# Patient Record
Sex: Male | Born: 1975 | Race: White | Hispanic: No | Marital: Married | Smoking: Never smoker
Health system: Southern US, Community
[De-identification: ages and names within clinical notes are randomized; demographics above are authoritative.]

## PROBLEM LIST (undated history)

## (undated) DIAGNOSIS — R6889 Other general symptoms and signs: Secondary | ICD-10-CM

## (undated) DIAGNOSIS — E785 Hyperlipidemia, unspecified: Secondary | ICD-10-CM

## (undated) DIAGNOSIS — N1330 Unspecified hydronephrosis: Secondary | ICD-10-CM

## (undated) DIAGNOSIS — N281 Cyst of kidney, acquired: Secondary | ICD-10-CM

## (undated) DIAGNOSIS — E0500 Thyrotoxicosis with diffuse goiter without thyrotoxic crisis or storm: Secondary | ICD-10-CM

## (undated) DIAGNOSIS — Z87898 Personal history of other specified conditions: Secondary | ICD-10-CM

## (undated) DIAGNOSIS — E0590 Thyrotoxicosis, unspecified without thyrotoxic crisis or storm: Secondary | ICD-10-CM

## (undated) DIAGNOSIS — I10 Essential (primary) hypertension: Secondary | ICD-10-CM

---

## 2015-01-14 ENCOUNTER — Emergency Department (HOSPITAL_BASED_OUTPATIENT_CLINIC_OR_DEPARTMENT_OTHER)
Admission: EM | Admit: 2015-01-14 | Discharge: 2015-01-14 | Disposition: A | Discharge status: Home / Self Care | Payer: Commercial Managed Care - HMO | Attending: Emergency Medicine | Admitting: Emergency Medicine

## 2015-01-14 ENCOUNTER — Encounter (HOSPITAL_BASED_OUTPATIENT_CLINIC_OR_DEPARTMENT_OTHER): Payer: Self-pay | Admitting: Emergency Medicine

## 2015-01-14 DIAGNOSIS — H1013 Acute atopic conjunctivitis, bilateral: Secondary | ICD-10-CM | POA: Insufficient documentation

## 2015-01-14 DIAGNOSIS — H5713 Ocular pain, bilateral: Secondary | ICD-10-CM | POA: Diagnosis present

## 2015-01-14 MED ORDER — DEXAMETHASONE 4 MG PO TABS: 8.0000 mg | ORAL_TABLET | Freq: Once | ORAL | Status: DC | PRN

## 2015-01-14 MED ORDER — DESLORATADINE 5 MG PO TABS: 5.0000 mg | ORAL_TABLET | Freq: Every day | ORAL | Status: DC

## 2015-01-14 MED ORDER — HYPROMELLOSE (GONIOSCOPIC) 2.5 % OP SOLN: 1.0000 [drp] | Freq: Three times a day (TID) | OPHTHALMIC | Status: AC | PRN

## 2015-01-14 NOTE — ED Notes (Signed)
Eyes swollen ,nasal congestion, eyes watering

## 2015-01-14 NOTE — Discharge Instructions (Signed)
Allergic Conjunctivitis  The conjunctiva is a thin membrane that covers the visible white part of the eyeball and the underside of the eyelids. This membrane protects and lubricates the eye. The membrane has small blood vessels running through it that can normally be seen. When the conjunctiva becomes inflamed, the condition is called conjunctivitis. In response to the inflammation, the conjunctival blood vessels become swollen. The swelling results in redness in the normally white part of the eye.  The blood vessels of this membrane also react when a person has allergies and is then called allergic conjunctivitis. This condition usually lasts for as long as the allergy persists. Allergic conjunctivitis cannot be passed to another person (non-contagious). The likelihood of bacterial infection is great and the cause is not likely due to allergies if the inflamed eye has:  · A sticky discharge.  · Discharge or sticking together of the lids in the morning.  · Scaling or flaking of the eyelids where the eyelashes come out.  · Red swollen eyelids.  CAUSES   · Viruses.  · Irritants such as foreign bodies.  · Chemicals.  · General allergic reactions.  · Inflammation or serious diseases in the inside or the outside of the eye or the orbit (the boney cavity in which the eye sits) can cause a "red eye."  SYMPTOMS   · Eye redness.  · Tearing.  · Itchy eyes.  · Burning feeling in the eyes.  · Clear drainage from the eye.  · Allergic reaction due to pollens or ragweed sensitivity. Seasonal allergic conjunctivitis is frequent in the spring when pollens are in the air and in the fall.  DIAGNOSIS   This condition, in its many forms, is usually diagnosed based on the history and an ophthalmological exam. It usually involves both eyes. If your eyes react at the same time every year, allergies may be the cause. While most "red eyes" are due to allergy or an infection, the role of an eye (ophthalmological) exam is important. The exam  can rule out serious diseases of the eye or orbit.  TREATMENT   · Non-antibiotic eye drops, ointments, or medications by mouth may be prescribed if the ophthalmologist is sure the conjunctivitis is due to allergies alone.  · Over-the-counter drops and ointments for allergic symptoms should be used only after other causes of conjunctivitis have been ruled out, or as your caregiver suggests.  Medications by mouth are often prescribed if other allergy-related symptoms are present. If the ophthalmologist is sure that the conjunctivitis is due to allergies alone, treatment is normally limited to drops or ointments to reduce itching and burning.  HOME CARE INSTRUCTIONS   · Wash hands before and after applying drops or ointments, or touching the inflamed eye(s) or eyelids.  · Do not let the eye dropper tip or ointment tube touch the eyelid when putting medicine in your eye.  · Stop using your soft contact lenses and throw them away. Use a new pair of lenses when recovery is complete. You should run through sterilizing cycles at least three times before use after complete recovery if the old soft contact lenses are to be used. Hard contact lenses should be stopped. They need to be thoroughly sterilized before use after recovery.  · Itching and burning eyes due to allergies is often relieved by using a cool cloth applied to closed eye(s).  SEEK MEDICAL CARE IF:   · Your problems do not go away after two or three days of treatment.  ·   Your lids are sticky (especially in the morning when you wake up) or stick together.  · Discharge develops. Antibiotics may be needed either as drops, ointment, or by mouth.  · You have extreme light sensitivity.  · An oral temperature above 102° F (38.9° C) develops.  · Pain in or around the eye or any other visual symptom develops.  MAKE SURE YOU:   · Understand these instructions.  · Will watch your condition.  · Will get help right away if you are not doing well or get worse.  Document  Released: 07/15/2002 Document Revised: 07/17/2011 Document Reviewed: 06/10/2007  [employer]® Patient Information ©2015 [employer]. This information is not intended to replace advice given to you by your health care provider. Make sure you discuss any questions you have with your health care provider.

## 2015-01-14 NOTE — ED Provider Notes (Signed)
CSN: [id number]     Arrival date & time 01/14/15  6440 History   First MD Initiated Contact with Patient 01/14/15 [phone]     Chief Complaint  Patient presents with  . Allergies     (Consider location/radiation/quality/duration/timing/severity/associated sxs/prior Treatment) Patient is a 39 y.o. male presenting with eye problem. The history is provided by the patient.  Eye Problem Location:  Both Quality:  Aching (swelling) Severity:  Moderate Onset quality:  Gradual Duration:  3 days Timing:  Constant Progression:  Worsening Chronicity:  New Context comment:  Walking at park Relieved by:  Nothing Worsened by:  Nothing tried Ineffective treatments:  None tried Associated symptoms: inflammation and redness   Associated symptoms: no decreased vision, no facial rash and no photophobia   Risk factors: no conjunctival hemorrhage     History reviewed. No pertinent past medical history. History reviewed. No pertinent past surgical history. No family history on file. Social History  Substance Use Topics  . Smoking status: None  . Smokeless tobacco: None  . Alcohol Use: None    Review of Systems  Eyes: Positive for redness. Negative for photophobia.  All other systems reviewed and are negative.     Allergies  Review of patient's allergies indicates no known allergies.  Home Medications   Prior to Admission medications   Medication Sig Start Date End Date Taking? Authorizing Provider  desloratadine (CLARINEX) 5 MG tablet Take 1 tablet (5 mg total) by mouth daily. 01/14/15   Lyndal Pulley, MD  dexamethasone (DECADRON) 4 MG tablet Take 2 tablets (8 mg total) by mouth once as needed (worsening eye swelling). 01/14/15   Lyndal Pulley, MD  hydroxypropyl methylcellulose / hypromellose (ISOPTO TEARS / GONIOVISC) 2.5 % ophthalmic solution Place 1 drop into both eyes 3 (three) times daily as needed for dry eyes. 01/14/15   Lyndal Pulley, MD   BP 130/65 mmHg  Pulse 94  Temp(Src) 98.1 F  (36.7 C) (Oral)  Resp 16  Ht  (1.727 m)  Wt 205 lb (92.987 kg)  BMI 31.18 kg/m2  SpO2 99% Physical Exam  Constitutional: He is oriented to person, place, and time. He appears well-developed and well-nourished. No distress.  HENT:  Head: Normocephalic and atraumatic.  Eyes: Conjunctivae are normal.  Mild periorbital swelling, conjunctival injection  Neck: Neck supple. No tracheal deviation present.  Cardiovascular: Normal rate and regular rhythm.   Pulmonary/Chest: Effort normal. No respiratory distress.  Abdominal: Soft. He exhibits no distension.  Neurological: He is alert and oriented to person, place, and time.  Skin: Skin is warm and dry.  Psychiatric: He has a normal mood and affect.    ED Course  Procedures (including critical care time) Labs Review Labs Reviewed - No data to display  Imaging Review No results found. I have personally reviewed and evaluated these images and lab results as part of my medical decision-making.   EKG Interpretation None      MDM   Final diagnoses:  Allergic conjunctivitis, bilateral    39 y.o. male presents with eye swelling, injection since a day at the park 3 days ago. Suspect environmental allergies. Recommended antihistamines and provided with single dose of decadron for home if continuing to worsen despite therapy. Plan to follow up with PCP as needed and return precautions discussed for worsening or new concerning symptoms.     Lyndal Pulley, MD 01/15/15 [phone]

## 2015-02-23 ENCOUNTER — Encounter: Payer: Self-pay | Admitting: Endocrinology

## 2015-02-23 ENCOUNTER — Other Ambulatory Visit (INDEPENDENT_AMBULATORY_CARE_PROVIDER_SITE_OTHER): Payer: Commercial Managed Care - HMO

## 2015-02-23 ENCOUNTER — Ambulatory Visit (INDEPENDENT_AMBULATORY_CARE_PROVIDER_SITE_OTHER): Payer: Commercial Managed Care - HMO | Admitting: Endocrinology

## 2015-02-23 VITALS — BP 128/80 | HR 115 | Temp 98.6°F | Ht 69.0 in | Wt 212.0 lb

## 2015-02-23 DIAGNOSIS — E0590 Thyrotoxicosis, unspecified without thyrotoxic crisis or storm: Secondary | ICD-10-CM

## 2015-02-23 LAB — TSH: TSH: 0.01 u[IU]/mL — AB (ref 0.35–4.50)

## 2015-02-23 LAB — T4, FREE: Free T4: 2.64 ng/dL — ABNORMAL HIGH (ref 0.60–1.60)

## 2015-02-23 MED ORDER — METOPROLOL SUCCINATE ER 25 MG PO TB24: 25.0000 mg | ORAL_TABLET | Freq: Every day | ORAL | Status: DC

## 2015-02-23 MED ORDER — METHIMAZOLE 10 MG PO TABS: 20.0000 mg | ORAL_TABLET | Freq: Three times a day (TID) | ORAL | Status: DC

## 2015-02-23 NOTE — Progress Notes (Signed)
Subjective:    Patient ID: Antonio Grimes, male    DOB: Mar 02, 1976, 39 y.o.   MRN: [id number]  HPI Pt reports 6 weeks of slight swelling around the eyes, and assoc swelling of the legs.  he has no prior h/o hyperthyroid dz.  He has never been on therapy for this.  He has never had XRT to the anterior neck, or thyroid surgery.  He has never had thyroid imaging.  He does not consume kelp or any other prescribed or non-prescribed thyroid medication.  He has never been on amiodarone.  Wife says pt's face is less swollen the past 5 days or so.   No past medical history on file.  No past surgical history on file.  Social History   Social History  . Marital Status: Married    Spouse Name: N/A  . Number of Children: N/A  . Years of Education: N/A   Occupational History  . Not on file.   Social History Main Topics  . Smoking status: Never Smoker   . Smokeless tobacco: Not on file  . Alcohol Use: Not on file  . Drug Use: Not on file  . Sexual Activity: Not on file   Other Topics Concern  . Not on file   Social History Narrative    Current Outpatient Prescriptions on File Prior to Visit  Medication Sig Dispense Refill  . desloratadine (CLARINEX) 5 MG tablet Take 1 tablet (5 mg total) by mouth daily. 30 tablet 0  . hydroxypropyl methylcellulose / hypromellose (ISOPTO TEARS / GONIOVISC) 2.5 % ophthalmic solution Place 1 drop into both eyes 3 (three) times daily as needed for dry eyes. 15 mL 12   No current facility-administered medications on file prior to visit.    No Known Allergies  Family History  Problem Relation Age of Onset  . Thyroid disease Maternal Grandfather     BP 128/80 mmHg  Pulse 115  Temp(Src) 98.6 F (37 C) (Oral)  Ht  (1.753 m)  Wt 212 lb (96.163 kg)  BMI 31.29 kg/m2  SpO2 97%  Review of Systems denies headache, hoarseness, visual loss, palpitations, sob, diarrhea, polyuria, muscle weakness, tremor, anxiety, easy bruising, and rhinorrhea.  He has  heat intolerance, excessive diaphoresis, and weight loss.    Objective:   Physical Exam VS: see vs page GEN: no distress HEAD: head: no deformity eyes: no periorbital swelling, no proptosis external nose and ears are normal mouth: no lesion seen NECK: thyroid is slightly and diffusely enlarged CHEST WALL: no deformity.  LUNGS: clear to auscultation.   CV: tachycardic rate, but reg rhythm, no murmur ABD: abdomen is soft, nontender.  no hepatosplenomegaly.  not distended.  no hernia MUSCULOSKELETAL: muscle bulk and strength are grossly normal.  no obvious joint swelling.  gait is normal and steady EXTEMITIES: no deformity.  Trace bilat leg edema.   PULSES: no carotid bruit NEURO:  cn 2-12 grossly intact.   readily moves all 4's.  sensation is intact to touch on all 4's.  No tremor. SKIN:  Normal texture and temperature.  No rash or suspicious lesion is visible.   NODES:  None palpable at the neck PSYCH: alert, well-oriented.  Does not appear anxious nor depressed.   outside test results are reviewed: TSH=0.02 Free T4=2.88  I have reviewed outside records, and summarized: Pt was noted to have hyperthyroidism, and ref here.  Lab Results  Component Value Date   TSH 0.01* 02/23/2015      Assessment &  Plan:  Hyperthyroidism, new, prob due to Grave's dz.  In view of tachycardia, tapazole is preferred over I-131, at least at first.    Patient is advised the following: Patient Instructions  Let's recheck the blood tests.  We'll let you know about the results. If the thyroid is still significantly high, i'll prescribe medication for this, and another med to slow the heart rate.   if ever you have fever while taking methimazole, stop it and call us, because of the risk of a rare side-effect.   Please come back for a follow-up appointment in 2-3 weeks.    addendum: i rx'ed tapazole and toprol.

## 2015-02-23 NOTE — Patient Instructions (Addendum)
Let's recheck the blood tests.  We'll let you know about the results. If the thyroid is still significantly high, i'll prescribe medication for this, and another med to slow the heart rate.   if ever you have fever while taking methimazole, stop it and call us, because of the risk of a rare side-effect.   Please come back for a follow-up appointment in 2-3 weeks.

## 2015-02-24 ENCOUNTER — Telehealth: Payer: Self-pay | Admitting: Endocrinology

## 2015-02-24 NOTE — Telephone Encounter (Signed)
Patient is calling for results of lab work. Send to  [id number] - [place], [place] - [address] [phone] (Phone) [phone] (Fax)

## 2015-02-25 ENCOUNTER — Telehealth: Payer: Self-pay | Admitting: Endocrinology

## 2015-02-25 ENCOUNTER — Other Ambulatory Visit: Payer: Self-pay | Admitting: Endocrinology

## 2015-02-25 MED ORDER — METHIMAZOLE 10 MG PO TABS: 20.0000 mg | ORAL_TABLET | Freq: Three times a day (TID) | ORAL | Status: DC

## 2015-02-25 NOTE — Telephone Encounter (Signed)
Please release labs to My Chart

## 2015-02-25 NOTE — Telephone Encounter (Signed)
Please load ref provider from faxed paperwork (in scanning bin), and cc note to ref provider

## 2015-02-25 NOTE — Telephone Encounter (Signed)
Patient would like lab results sent to my chart

## 2015-02-25 NOTE — Telephone Encounter (Signed)
done

## 2015-03-02 NOTE — Telephone Encounter (Signed)
I searched for the paperwork and the papers have already been sent to scanning. I contacted the pt and left a voicemail requesting a call back to verify PCP.

## 2015-03-09 ENCOUNTER — Ambulatory Visit (INDEPENDENT_AMBULATORY_CARE_PROVIDER_SITE_OTHER): Payer: Commercial Managed Care - HMO | Admitting: Endocrinology

## 2015-03-09 ENCOUNTER — Encounter: Payer: Self-pay | Admitting: Endocrinology

## 2015-03-09 VITALS — BP 122/87 | HR 90 | Temp 98.1°F | Ht 69.0 in | Wt 210.0 lb

## 2015-03-09 DIAGNOSIS — E0590 Thyrotoxicosis, unspecified without thyrotoxic crisis or storm: Secondary | ICD-10-CM | POA: Diagnosis not present

## 2015-03-09 LAB — TSH: TSH: 0.23 u[IU]/mL — AB (ref 0.35–4.50)

## 2015-03-09 LAB — T4, FREE: Free T4: 0.72 ng/dL (ref 0.60–1.60)

## 2015-03-09 MED ORDER — METHIMAZOLE 10 MG PO TABS: 10.0000 mg | ORAL_TABLET | Freq: Every day | ORAL | Status: DC

## 2015-03-09 NOTE — Patient Instructions (Addendum)
Let's recheck the blood tests.  We'll let you know about the results. if ever you have fever while taking methimazole, stop it and call us, because of the risk of a rare side-effect.   Please come back for a follow-up appointment in 1 month.   In the future, we can consider the radioactive iodine pill.  It works like this: We would first check a thyroid "scan" (a special, but easy and painless type of thyroid x ray).  It works like this: you go to the x-ray department of the hospital to swallow a pill, which contains a miniscule amount of radiation.  You will not notice any symptoms from this.  You will go back to the x-ray department the next day, to lie down in front of a camera.  The results of this will be sent to me.   Based on the results, i hope to order for you a treatment pill of radioactive iodine.  Although it is a larger amount of radiation, you will again notice no symptoms from this.  The pill is gone from your body in a few days (during which you should stay away from other people), but takes several months to work.  Therefore, please return here approximately 6-8 weeks after the treatment.  This treatment has been available for many years, and the only known side-effect is an underactive thyroid.  It is possible that i would eventually prescribe for you a thyroid hormone pill, which is very inexpensive.  You don't have to worry about side-effects of this thyroid hormone pill, because it is the same molecule your thyroid makes.

## 2015-03-09 NOTE — Progress Notes (Signed)
Subjective:    Patient ID: Antonio BrownsDavid T Hurlock, male    DOB: 01-03-1976, 39 y.o.   MRN: [id number]  HPI Pt returns for f/u of hyperthyroidism (dx'ed mid-2016; tapazole was chosen as initial rx, due to tachycardia; he has never had thyroid imaging).  Since on tapazole, pt says he feels somewhat better overall.  Facial swelling persists.   No past medical history on file.  No past surgical history on file.  Social History   Social History  . Marital Status: Married    Spouse Name: N/A  . Number of Children: N/A  . Years of Education: N/A   Occupational History  . Not on file.   Social History Main Topics  . Smoking status: Never Smoker   . Smokeless tobacco: Not on file  . Alcohol Use: Not on file  . Drug Use: Not on file  . Sexual Activity: Not on file   Other Topics Concern  . Not on file   Social History Narrative    Current Outpatient Prescriptions on File Prior to Visit  Medication Sig Dispense Refill  . furosemide (LASIX) 20 MG tablet TAKE 1 TABLET ONCE A DAY AS NEEDED, PREFER 1 TO 2 TIMES A WEEK.  0  . hydroxypropyl methylcellulose / hypromellose (ISOPTO TEARS / GONIOVISC) 2.5 % ophthalmic solution Place 1 drop into both eyes 3 (three) times daily as needed for dry eyes. 15 mL 12  . metoprolol succinate (TOPROL-XL) 25 MG 24 hr tablet Take 1 tablet (25 mg total) by mouth daily. 30 tablet 1  . desloratadine (CLARINEX) 5 MG tablet Take 1 tablet (5 mg total) by mouth daily. (Patient not taking: Reported on 03/09/2015) 30 tablet 0  . doxycycline (DORYX) 100 MG EC tablet TAKE 1 TABLET EVERY 12 HRS ORALLY FOR 7 DAYS  0   No current facility-administered medications on file prior to visit.    No Known Allergies  Family History  Problem Relation Age of Onset  . Thyroid disease Maternal Grandfather     BP 122/87 mmHg  Pulse 90  Temp(Src) 98.1 F (36.7 C) (Oral)  Ht 5\' 9"  (1.753 m)  Wt 210 lb (95.255 kg)  BMI 31.00 kg/m2  SpO2 96%  Review of Systems Denies fever.        Objective:   Physical Exam VITAL SIGNS:  See vs page GENERAL: no distress Skin: nor diaphoretic.   Neuro: no tremor.   Lab Results  Component Value Date   TSH 0.23* 03/09/2015      Assessment & Plan:  Hyperthyroidism: much better.  Patient is advised the following: Patient Instructions  Let's recheck the blood tests.  We'll let you know about the results. if ever you have fever while taking methimazole, stop it and call us, because of the risk of a rare side-effect.   Please come back for a follow-up appointment in 1 month.   In the future, we can consider the radioactive iodine pill.  It works like this: We would first check a thyroid "scan" (a special, but easy and painless type of thyroid x ray).  It works like this: you go to the x-ray department of the hospital to swallow a pill, which contains a miniscule amount of radiation.  You will not notice any symptoms from this.  You will go back to the x-ray department the next day, to lie down in front of a camera.  The results of this will be sent to me.   Based on the results, i  hope to order for you a treatment pill of radioactive iodine.  Although it is a larger amount of radiation, you will again notice no symptoms from this.  The pill is gone from your body in a few days (during which you should stay away from other people), but takes several months to work.  Therefore, please return here approximately 6-8 weeks after the treatment.  This treatment has been available for many years, and the only known side-effect is an underactive thyroid.  It is possible that i would eventually prescribe for you a thyroid hormone pill, which is very inexpensive.  You don't have to worry about side-effects of this thyroid hormone pill, because it is the same molecule your thyroid makes.

## 2015-03-10 ENCOUNTER — Other Ambulatory Visit: Payer: Self-pay

## 2015-03-10 MED ORDER — METHIMAZOLE 10 MG PO TABS: 10.0000 mg | ORAL_TABLET | Freq: Every day | ORAL | Status: DC

## 2015-04-08 ENCOUNTER — Encounter: Payer: Self-pay | Admitting: Endocrinology

## 2015-04-08 ENCOUNTER — Ambulatory Visit (INDEPENDENT_AMBULATORY_CARE_PROVIDER_SITE_OTHER): Payer: Commercial Managed Care - HMO | Admitting: Endocrinology

## 2015-04-08 VITALS — BP 118/64 | HR 76 | Temp 98.3°F | Ht 69.0 in | Wt 214.0 lb

## 2015-04-08 DIAGNOSIS — E0590 Thyrotoxicosis, unspecified without thyrotoxic crisis or storm: Secondary | ICD-10-CM | POA: Diagnosis not present

## 2015-04-08 LAB — TSH: TSH: 1.29 u[IU]/mL (ref 0.35–4.50)

## 2015-04-08 LAB — T4, FREE: FREE T4: 0.56 ng/dL — AB (ref 0.60–1.60)

## 2015-04-08 MED ORDER — METHIMAZOLE 5 MG PO TABS: 5.0000 mg | ORAL_TABLET | Freq: Every day | ORAL | Status: AC

## 2015-04-08 NOTE — Patient Instructions (Addendum)
Let's recheck the blood tests.  We'll let you know about the results. if ever you have fever while taking methimazole, stop it and call us, because of the risk of a rare side-effect.   Please come back for a follow-up appointment in 2 months.

## 2015-04-08 NOTE — Progress Notes (Signed)
   Subjective:    Patient ID: Antonio Grimes, male    DOB: 1976-04-10, 39 y.o.   MRN: [id number]  HPI Pt returns for f/u of hyperthyroidism (dx'ed mid-2016; tapazole was chosen as initial rx, due to tachycardia, and he chooses to continue; he has never had thyroid imaging, but Grave's dz is presumed due to severity).  pt states he feels well in general.   No past medical history on file.  No past surgical history on file.  Social History   Social History  . Marital Status: Married    Spouse Name: N/A  . Number of Children: N/A  . Years of Education: N/A   Occupational History  . Not on file.   Social History Main Topics  . Smoking status: Never Smoker   . Smokeless tobacco: Not on file  . Alcohol Use: Not on file  . Drug Use: Not on file  . Sexual Activity: Not on file   Other Topics Concern  . Not on file   Social History Narrative    Current Outpatient Prescriptions on File Prior to Visit  Medication Sig Dispense Refill  . furosemide (LASIX) 20 MG tablet TAKE 1 TABLET ONCE A DAY AS NEEDED, PREFER 1 TO 2 TIMES A WEEK.  0  . hydroxypropyl methylcellulose / hypromellose (ISOPTO TEARS / GONIOVISC) 2.5 % ophthalmic solution Place 1 drop into both eyes 3 (three) times daily as needed for dry eyes. 15 mL 12  . metoprolol succinate (TOPROL-XL) 25 MG 24 hr tablet Take 1 tablet (25 mg total) by mouth daily. 30 tablet 1  . desloratadine (CLARINEX) 5 MG tablet Take 1 tablet (5 mg total) by mouth daily. (Patient not taking: Reported on 03/09/2015) 30 tablet 0  . doxycycline (DORYX) 100 MG EC tablet TAKE 1 TABLET EVERY 12 HRS ORALLY FOR 7 DAYS  0   No current facility-administered medications on file prior to visit.    No Known Allergies  Family History  Problem Relation Age of Onset  . Thyroid disease Maternal Grandfather     BP 118/64 mmHg  Pulse 76  Temp(Src) 98.3 F (36.8 C) (Oral)  Ht 5\' 9"  (1.753 m)  Wt 214 lb (97.07 kg)  BMI 31.59 kg/m2  SpO2 97%  Review of  Systems Denies fever    Objective:   Physical Exam VITAL SIGNS:  See vs page GENERAL: no distress. NECK: thyroid is slightly and diffusely enlarged.    Lab Results  Component Value Date   TSH 1.29 04/08/2015       Assessment & Plan:  Hyperthyroidism, improved.  Patient is advised the following: Patient Instructions  Let's recheck the blood tests.  We'll let you know about the results. if ever you have fever while taking methimazole, stop it and call us, because of the risk of a rare side-effect.   Please come back for a follow-up appointment in 2 months.     reduce the methimazole to 5 mg daily

## 2015-04-09 ENCOUNTER — Encounter: Payer: Self-pay | Admitting: Endocrinology

## 2015-04-12 ENCOUNTER — Encounter: Payer: Self-pay | Admitting: Endocrinology

## 2015-04-13 ENCOUNTER — Other Ambulatory Visit: Payer: Self-pay | Admitting: Endocrinology

## 2015-04-13 DIAGNOSIS — E0590 Thyrotoxicosis, unspecified without thyrotoxic crisis or storm: Secondary | ICD-10-CM

## 2015-04-20 ENCOUNTER — Other Ambulatory Visit (INDEPENDENT_AMBULATORY_CARE_PROVIDER_SITE_OTHER): Payer: Commercial Managed Care - HMO

## 2015-04-20 DIAGNOSIS — E0590 Thyrotoxicosis, unspecified without thyrotoxic crisis or storm: Secondary | ICD-10-CM | POA: Diagnosis not present

## 2015-04-20 LAB — CBC WITH DIFFERENTIAL/PLATELET
BASOS ABS: 0.1 10*3/uL (ref 0.0–0.1)
Basophils Relative: 1 % (ref 0.0–3.0)
EOS ABS: 0.3 10*3/uL (ref 0.0–0.7)
Eosinophils Relative: 5.2 % — ABNORMAL HIGH (ref 0.0–5.0)
HEMATOCRIT: 42.4 % (ref 39.0–52.0)
HEMOGLOBIN: 14.2 g/dL (ref 13.0–17.0)
LYMPHS PCT: 27.9 % (ref 12.0–46.0)
Lymphs Abs: 1.7 10*3/uL (ref 0.7–4.0)
MCHC: 33.4 g/dL (ref 30.0–36.0)
MCV: 79.1 fl (ref 78.0–100.0)
MONOS PCT: 8.8 % (ref 3.0–12.0)
Monocytes Absolute: 0.5 10*3/uL (ref 0.1–1.0)
NEUTROS ABS: 3.5 10*3/uL (ref 1.4–7.7)
Neutrophils Relative %: 57.1 % (ref 43.0–77.0)
PLATELETS: 295 10*3/uL (ref 150.0–400.0)
RBC: 5.37 Mil/uL (ref 4.22–5.81)
RDW: 14.8 % (ref 11.5–15.5)
WBC: 6.1 10*3/uL (ref 4.0–10.5)

## 2015-04-20 LAB — TSH: TSH: 0.48 u[IU]/mL (ref 0.35–4.50)

## 2015-04-20 LAB — T4, FREE: FREE T4: 0.83 ng/dL (ref 0.60–1.60)

## 2015-04-21 ENCOUNTER — Encounter: Payer: Self-pay | Admitting: Endocrinology

## 2015-06-09 ENCOUNTER — Ambulatory Visit (INDEPENDENT_AMBULATORY_CARE_PROVIDER_SITE_OTHER): Payer: Commercial Managed Care - HMO | Admitting: Endocrinology

## 2015-06-09 ENCOUNTER — Encounter: Payer: Self-pay | Admitting: Endocrinology

## 2015-06-09 VITALS — BP 134/86 | HR 76 | Temp 97.8°F | Ht 69.0 in | Wt 208.0 lb

## 2015-06-09 DIAGNOSIS — E0590 Thyrotoxicosis, unspecified without thyrotoxic crisis or storm: Secondary | ICD-10-CM

## 2015-06-09 LAB — T4, FREE: Free T4: 0.82 ng/dL (ref 0.60–1.60)

## 2015-06-09 LAB — TSH: TSH: 1.27 u[IU]/mL (ref 0.35–4.50)

## 2015-06-09 NOTE — Patient Instructions (Signed)
Let's recheck the blood tests.  We'll let you know about the results.  if ever you have fever while taking methimazole, stop it and call us, because of the risk of a rare side-effect.   Please come back for a follow-up appointment in 4 months.

## 2015-06-09 NOTE — Progress Notes (Signed)
   Subjective:    Patient ID: Antonio Grimes, male    DOB: January 01, 1976, 40 y.o.   MRN: [id number]  HPI Pt returns for f/u of hyperthyroidism (dx'ed mid-2016; tapazole was chosen as initial rx, due to tachycardia, and he chooses to continue; he has never had thyroid imaging, but Grave's dz is presumed due to severity).  pt states he feels well in general.  He takes tapazole as rx'ed.  No past medical history on file.  No past surgical history on file.  Social History   Social History  . Marital Status: Married    Spouse Name: N/A  . Number of Children: N/A  . Years of Education: N/A   Occupational History  . Not on file.   Social History Main Topics  . Smoking status: Never Smoker   . Smokeless tobacco: Not on file  . Alcohol Use: Not on file  . Drug Use: Not on file  . Sexual Activity: Not on file   Other Topics Concern  . Not on file   Social History Narrative    Current Outpatient Prescriptions on File Prior to Visit  Medication Sig Dispense Refill  . desloratadine (CLARINEX) 5 MG tablet Take 1 tablet (5 mg total) by mouth daily. 30 tablet 0  . furosemide (LASIX) 20 MG tablet TAKE 1 TABLET ONCE A DAY AS NEEDED, PREFER 1 TO 2 TIMES A WEEK.  0  . hydroxypropyl methylcellulose / hypromellose (ISOPTO TEARS / GONIOVISC) 2.5 % ophthalmic solution Place 1 drop into both eyes 3 (three) times daily as needed for dry eyes. 15 mL 12  . methimazole (TAPAZOLE) 5 MG tablet Take 1 tablet (5 mg total) by mouth daily. 30 tablet 2  . doxycycline (DORYX) 100 MG EC tablet Reported on 06/09/2015  0  . metoprolol succinate (TOPROL-XL) 25 MG 24 hr tablet Take 1 tablet (25 mg total) by mouth daily. (Patient not taking: Reported on 06/09/2015) 30 tablet 1   No current facility-administered medications on file prior to visit.    No Known Allergies  Family History  Problem Relation Age of Onset  . Thyroid disease Maternal Grandfather     BP 134/86 mmHg  Pulse 76  Temp(Src) 97.8 F (36.6 C)  (Oral)  Ht  (1.753 m)  Wt 208 lb (94.348 kg)  BMI 30.70 kg/m2  SpO2 96%  Review of Systems Denies fever    Objective:   Physical Exam VITAL SIGNS:  See vs page GENERAL: no distress head: no deformity eyes: moderate periorbital swelling, and bilat proptosis Skin: not diaphoretic Neuro: no tremor  Lab Results  Component Value Date   TSH 1.27 06/09/2015       Assessment & Plan:    Patient is advised the following: Patient Instructions  Let's recheck the blood tests.  We'll let you know about the results.  if ever you have fever while taking methimazole, stop it and call us, because of the risk of a rare side-effect.   Please come back for a follow-up appointment in 4 months.     addendum: Please continue the same medication

## 2015-07-25 ENCOUNTER — Encounter: Payer: Self-pay | Admitting: Endocrinology

## 2015-07-26 ENCOUNTER — Other Ambulatory Visit (INDEPENDENT_AMBULATORY_CARE_PROVIDER_SITE_OTHER): Payer: Managed Care, Other (non HMO)

## 2015-07-26 ENCOUNTER — Other Ambulatory Visit: Payer: Self-pay | Admitting: Endocrinology

## 2015-07-26 DIAGNOSIS — E0590 Thyrotoxicosis, unspecified without thyrotoxic crisis or storm: Secondary | ICD-10-CM

## 2015-07-26 LAB — TSH: TSH: 1.24 u[IU]/mL (ref 0.35–4.50)

## 2015-07-26 LAB — T4, FREE: FREE T4: 0.92 ng/dL (ref 0.60–1.60)

## 2015-08-06 ENCOUNTER — Other Ambulatory Visit: Payer: Self-pay | Admitting: Gastroenterology

## 2015-08-06 DIAGNOSIS — R1013 Epigastric pain: Secondary | ICD-10-CM

## 2015-08-23 ENCOUNTER — Ambulatory Visit
Admission: RE | Admit: 2015-08-23 | Discharge: 2015-08-23 | Disposition: A | Discharge status: Home / Self Care | Payer: Managed Care, Other (non HMO) | Source: Ambulatory Visit | Attending: Gastroenterology | Admitting: Gastroenterology

## 2015-08-23 DIAGNOSIS — R1013 Epigastric pain: Secondary | ICD-10-CM

## 2015-10-07 ENCOUNTER — Ambulatory Visit: Payer: Commercial Managed Care - HMO | Admitting: Endocrinology

## 2015-10-07 HISTORY — PX: ESOPHAGOGASTRODUODENOSCOPY: SHX1529

## 2015-12-27 ENCOUNTER — Encounter (HOSPITAL_BASED_OUTPATIENT_CLINIC_OR_DEPARTMENT_OTHER): Payer: Self-pay | Admitting: *Deleted

## 2015-12-27 ENCOUNTER — Other Ambulatory Visit: Payer: Self-pay | Admitting: Urology

## 2015-12-27 NOTE — Progress Notes (Signed)
NPO AFTER MN W/ EXCEPTION CLEAR LIQUIDS UNTIL 0700 (NO CREAM/ MILK PRODUCTS) .  ARRIVE AT 1130.  NEED ISTAT AND EKG.

## 2015-12-31 ENCOUNTER — Ambulatory Visit (HOSPITAL_BASED_OUTPATIENT_CLINIC_OR_DEPARTMENT_OTHER): Payer: Managed Care, Other (non HMO) | Admitting: Anesthesiology

## 2015-12-31 ENCOUNTER — Other Ambulatory Visit: Payer: Self-pay

## 2015-12-31 ENCOUNTER — Encounter (HOSPITAL_BASED_OUTPATIENT_CLINIC_OR_DEPARTMENT_OTHER): Payer: Self-pay | Admitting: *Deleted

## 2015-12-31 ENCOUNTER — Ambulatory Visit (HOSPITAL_BASED_OUTPATIENT_CLINIC_OR_DEPARTMENT_OTHER)
Admission: RE | Admit: 2015-12-31 | Discharge: 2015-12-31 | Disposition: A | Discharge status: Home / Self Care | Payer: Managed Care, Other (non HMO) | Source: Ambulatory Visit | Attending: Urology | Admitting: Urology

## 2015-12-31 ENCOUNTER — Encounter (HOSPITAL_BASED_OUTPATIENT_CLINIC_OR_DEPARTMENT_OTHER)
Admission: RE | Disposition: A | Discharge status: Home / Self Care | Payer: Self-pay | Source: Ambulatory Visit | Attending: Urology

## 2015-12-31 DIAGNOSIS — Z87891 Personal history of nicotine dependence: Secondary | ICD-10-CM | POA: Diagnosis not present

## 2015-12-31 DIAGNOSIS — N1330 Unspecified hydronephrosis: Secondary | ICD-10-CM | POA: Diagnosis present

## 2015-12-31 DIAGNOSIS — I10 Essential (primary) hypertension: Secondary | ICD-10-CM | POA: Diagnosis not present

## 2015-12-31 DIAGNOSIS — N131 Hydronephrosis with ureteral stricture, not elsewhere classified: Secondary | ICD-10-CM | POA: Diagnosis not present

## 2015-12-31 DIAGNOSIS — Z79899 Other long term (current) drug therapy: Secondary | ICD-10-CM | POA: Insufficient documentation

## 2015-12-31 DIAGNOSIS — N135 Crossing vessel and stricture of ureter without hydronephrosis: Secondary | ICD-10-CM

## 2015-12-31 HISTORY — DX: Personal history of other specified conditions: Z87.898

## 2015-12-31 HISTORY — DX: Essential (primary) hypertension: I10

## 2015-12-31 HISTORY — DX: Cyst of kidney, acquired: N28.1

## 2015-12-31 HISTORY — PX: CYSTOSCOPY/RETROGRADE/URETEROSCOPY: SHX5316

## 2015-12-31 HISTORY — DX: Other general symptoms and signs: R68.89

## 2015-12-31 HISTORY — DX: Thyrotoxicosis, unspecified without thyrotoxic crisis or storm: E05.90

## 2015-12-31 HISTORY — DX: Thyrotoxicosis with diffuse goiter without thyrotoxic crisis or storm: E05.00

## 2015-12-31 HISTORY — DX: Unspecified hydronephrosis: N13.30

## 2015-12-31 HISTORY — DX: Hyperlipidemia, unspecified: E78.5

## 2015-12-31 LAB — POCT I-STAT, CHEM 8
BUN: 17 mg/dL (ref 6–20)
Calcium, Ion: 1.25 mmol/L (ref 1.13–1.30)
Chloride: 101 mmol/L (ref 101–111)
Creatinine, Ser: 1.3 mg/dL — ABNORMAL HIGH (ref 0.61–1.24)
Glucose, Bld: 88 mg/dL (ref 65–99)
HCT: 43 % (ref 39.0–52.0)
Hemoglobin: 14.6 g/dL (ref 13.0–17.0)
Potassium: 4 mmol/L (ref 3.5–5.1)
Sodium: 141 mmol/L (ref 135–145)
TCO2: 27 mmol/L (ref 0–100)

## 2015-12-31 SURGERY — CYSTOSCOPY/RETROGRADE/URETEROSCOPY
Anesthesia: General | Laterality: Bilateral

## 2015-12-31 MED ORDER — ONDANSETRON HCL 4 MG/2ML IJ SOLN
INTRAMUSCULAR | Status: DC | PRN
  Administered 2015-12-31: 4 mg via INTRAVENOUS

## 2015-12-31 MED ORDER — CIPROFLOXACIN IN D5W 200 MG/100ML IV SOLN
200.0000 mg | INTRAVENOUS | Status: AC
  Administered 2015-12-31: 200 mg via INTRAVENOUS
  Filled 2015-12-31: qty 100

## 2015-12-31 MED ORDER — FENTANYL CITRATE (PF) 100 MCG/2ML IJ SOLN
INTRAMUSCULAR | Status: AC
  Filled 2015-12-31: qty 2

## 2015-12-31 MED ORDER — LIDOCAINE HCL 2 % EX GEL
CUTANEOUS | Status: DC | PRN
  Administered 2015-12-31: 1 via URETHRAL

## 2015-12-31 MED ORDER — IOHEXOL 300 MG/ML  SOLN
INTRAMUSCULAR | Status: DC | PRN
  Administered 2015-12-31: 15 mL via URETHRAL

## 2015-12-31 MED ORDER — LIDOCAINE HCL (CARDIAC) 20 MG/ML IV SOLN
INTRAVENOUS | Status: AC
  Filled 2015-12-31: qty 5

## 2015-12-31 MED ORDER — CIPROFLOXACIN IN D5W 200 MG/100ML IV SOLN
INTRAVENOUS | Status: AC
  Filled 2015-12-31: qty 100

## 2015-12-31 MED ORDER — HYDROCODONE-ACETAMINOPHEN 10-325 MG PO TABS: 1.0000 | ORAL_TABLET | ORAL | 0 refills | Status: DC | PRN

## 2015-12-31 MED ORDER — LIDOCAINE HCL (CARDIAC) 20 MG/ML IV SOLN
INTRAVENOUS | Status: DC | PRN
Start: 2015-12-31 — End: 2015-12-31
  Administered 2015-12-31: 100 mg via INTRAVENOUS

## 2015-12-31 MED ORDER — MIDAZOLAM HCL 2 MG/2ML IJ SOLN
INTRAMUSCULAR | Status: AC
  Filled 2015-12-31: qty 2

## 2015-12-31 MED ORDER — PROPOFOL 10 MG/ML IV BOLUS
INTRAVENOUS | Status: AC
  Filled 2015-12-31: qty 40

## 2015-12-31 MED ORDER — PROPOFOL 10 MG/ML IV BOLUS
INTRAVENOUS | Status: DC | PRN
  Administered 2015-12-31: 200 mg via INTRAVENOUS

## 2015-12-31 MED ORDER — KETOROLAC TROMETHAMINE 30 MG/ML IJ SOLN
INTRAMUSCULAR | Status: DC | PRN
  Administered 2015-12-31: 30 mg via INTRAVENOUS

## 2015-12-31 MED ORDER — SODIUM CHLORIDE 0.9 % IR SOLN
Status: DC | PRN
  Administered 2015-12-31: 1000 mL via INTRAVESICAL
  Administered 2015-12-31: 3000 mL via INTRAVESICAL

## 2015-12-31 MED ORDER — MIDAZOLAM HCL 5 MG/5ML IJ SOLN
INTRAMUSCULAR | Status: DC | PRN
  Administered 2015-12-31: 2 mg via INTRAVENOUS

## 2015-12-31 MED ORDER — DEXAMETHASONE SODIUM PHOSPHATE 4 MG/ML IJ SOLN
INTRAMUSCULAR | Status: DC | PRN
Start: 2015-12-31 — End: 2015-12-31
  Administered 2015-12-31: 10 mg via INTRAVENOUS

## 2015-12-31 MED ORDER — FENTANYL CITRATE (PF) 100 MCG/2ML IJ SOLN
INTRAMUSCULAR | Status: DC | PRN
  Administered 2015-12-31 (×2): 50 ug via INTRAVENOUS

## 2015-12-31 MED ORDER — LACTATED RINGERS IV SOLN
INTRAVENOUS | Status: DC
  Administered 2015-12-31 (×2): via INTRAVENOUS
  Filled 2015-12-31: qty 1000

## 2015-12-31 MED ORDER — PHENAZOPYRIDINE HCL 200 MG PO TABS: 200.0000 mg | ORAL_TABLET | Freq: Three times a day (TID) | ORAL | 0 refills | Status: AC | PRN

## 2015-12-31 MED ORDER — KETOROLAC TROMETHAMINE 30 MG/ML IJ SOLN
INTRAMUSCULAR | Status: AC
  Filled 2015-12-31: qty 1

## 2015-12-31 MED ORDER — ONDANSETRON HCL 4 MG/2ML IJ SOLN
INTRAMUSCULAR | Status: AC
  Filled 2015-12-31: qty 2

## 2015-12-31 MED ORDER — DEXAMETHASONE SODIUM PHOSPHATE 10 MG/ML IJ SOLN
INTRAMUSCULAR | Status: AC
  Filled 2015-12-31: qty 1

## 2015-12-31 SURGICAL SUPPLY — 38 items
BAG DRAIN URO-CYSTO SKYTR STRL (DRAIN) ×2 IMPLANT
BASKET LASER NITINOL 1.9FR (BASKET) IMPLANT
BASKET STNLS GEMINI 4WIRE 3FR (BASKET) IMPLANT
BASKET ZERO TIP NITINOL 2.4FR (BASKET) IMPLANT
BRUSH URET BIOPSY 3F (UROLOGICAL SUPPLIES) ×2 IMPLANT
CATH INTERMIT  6FR 70CM (CATHETERS) ×2 IMPLANT
CATH URET 5FR 28IN CONE TIP (BALLOONS)
CATH URET 5FR 70CM CONE TIP (BALLOONS) IMPLANT
CLOTH BEACON ORANGE TIMEOUT ST (SAFETY) ×2 IMPLANT
ELECT REM PT RETURN 9FT ADLT (ELECTROSURGICAL)
ELECTRODE REM PT RTRN 9FT ADLT (ELECTROSURGICAL) IMPLANT
FIBER LASER FLEXIVA 1000 (UROLOGICAL SUPPLIES) IMPLANT
FIBER LASER FLEXIVA 365 (UROLOGICAL SUPPLIES) IMPLANT
FIBER LASER FLEXIVA 550 (UROLOGICAL SUPPLIES) IMPLANT
FIBER LASER TRAC TIP (UROLOGICAL SUPPLIES) IMPLANT
GLOVE BIO SURGEON STRL SZ8 (GLOVE) ×2 IMPLANT
GOWN STRL REUS W/ TWL XL LVL3 (GOWN DISPOSABLE) ×1 IMPLANT
GOWN STRL REUS W/TWL XL LVL3 (GOWN DISPOSABLE) ×1
GUIDEWIRE ANG ZIPWIRE 038X150 (WIRE) IMPLANT
GUIDEWIRE STR DUAL SENSOR (WIRE) ×4 IMPLANT
IV NS 1000ML (IV SOLUTION) ×1
IV NS 1000ML BAXH (IV SOLUTION) ×1 IMPLANT
IV NS IRRIG 3000ML ARTHROMATIC (IV SOLUTION) ×2 IMPLANT
KIT BALLIN UROMAX 15FX10 (LABEL) IMPLANT
KIT BALLN UROMAX 15FX4 (MISCELLANEOUS) IMPLANT
KIT BALLN UROMAX 26 75X4 (MISCELLANEOUS)
KIT ROOM TURNOVER WOR (KITS) ×2 IMPLANT
MANIFOLD NEPTUNE II (INSTRUMENTS) ×2 IMPLANT
NS IRRIG 500ML POUR BTL (IV SOLUTION) ×2 IMPLANT
PACK CYSTO (CUSTOM PROCEDURE TRAY) ×2 IMPLANT
SET HIGH PRES BAL DIL (LABEL)
SHEATH ACCESS URETERAL 24CM (SHEATH) ×2 IMPLANT
SHEATH ACCESS URETERAL 38CM (SHEATH) IMPLANT
STENT URET 6FRX24 CONTOUR (STENTS) ×2 IMPLANT
STENT URET 6FRX26 CONTOUR (STENTS) ×2 IMPLANT
SYRINGE IRR TOOMEY STRL 70CC (SYRINGE) IMPLANT
TUBE CONNECTING 12X1/4 (SUCTIONS) ×2 IMPLANT
WATER STERILE IRR 3000ML UROMA (IV SOLUTION) IMPLANT

## 2015-12-31 NOTE — Anesthesia Postprocedure Evaluation (Signed)
Anesthesia Post Note  Patient: Antonio Grimes  Procedure(s) Performed: Procedure(s) (LRB): CYSTOSCOPY/RETROGRADE/URETEROSCOPY/RIGHT URETERAL  BIOPSY/BILATERAL STENT PLACEMENT (Bilateral)  Patient location during evaluation: PACU Anesthesia Type: General Level of consciousness: awake and alert Pain management: pain level controlled Vital Signs Assessment: post-procedure vital signs reviewed and stable Respiratory status: spontaneous breathing, nonlabored ventilation, respiratory function stable and patient connected to nasal cannula oxygen Cardiovascular status: blood pressure returned to baseline and stable Postop Assessment: no signs of nausea or vomiting Anesthetic complications: no    Last Vitals:  Vitals:   12/31/15 1409 12/31/15 1415  BP: (!) 148/99 (!) 147/99  Pulse: 85 71  Resp: 13 15  Temp: 36.5 C     Last Pain:  Vitals:   12/31/15 1409  TempSrc:   PainSc: Asleep                 Shelvia Fojtik JENNETTE

## 2015-12-31 NOTE — Anesthesia Preprocedure Evaluation (Signed)
Anesthesia Evaluation  Patient identified by MRN, date of birth, ID band Patient awake    Reviewed: Allergy & Precautions, H&P , NPO status , Patient's Chart, lab work & pertinent test results  History of Anesthesia Complications Negative for: history of anesthetic complications  Airway Mallampati: II  TM Distance: >3 FB Neck ROM: full    Dental no notable dental hx.    Pulmonary neg pulmonary ROS,    Pulmonary exam normal breath sounds clear to auscultation       Cardiovascular hypertension, Normal cardiovascular exam Rhythm:regular Rate:Normal     Neuro/Psych negative neurological ROS     GI/Hepatic negative GI ROS, Neg liver ROS,   Endo/Other  Hyperthyroidism   Renal/GU Renal disease     Musculoskeletal   Abdominal   Peds  Hematology negative hematology ROS (+)   Anesthesia Other Findings Hx of Graves disease  Reproductive/Obstetrics negative OB ROS                             Anesthesia Physical Anesthesia Plan  ASA: II  Anesthesia Plan: General   Post-op Pain Management:    Induction: Intravenous  Airway Management Planned: LMA  Additional Equipment:   Intra-op Plan:   Post-operative Plan: Extubation in OR  Informed Consent: I have reviewed the patients History and Physical, chart, labs and discussed the procedure including the risks, benefits and alternatives for the proposed anesthesia with the patient or authorized representative who has indicated his/her understanding and acceptance.   Dental Advisory Given  Plan Discussed with: Anesthesiologist, CRNA and Surgeon  Anesthesia Plan Comments:         Anesthesia Quick Evaluation

## 2015-12-31 NOTE — Op Note (Signed)
PATIENT:  Antonio Grimes  PRE-OPERATIVE DIAGNOSIS: Bilateral ureteral obstruction  POST-OPERATIVE DIAGNOSIS: Same  PROCEDURE: 1. Cystoscopy with bilateral retrograde pyelograms including interpretation. 2. Bilateral ureteroscopy. 3. Right ureteral biopsy. 4. Bilateral double-J stent placement  SURGEON:  Garnett FarmMark C Isidora Laham  INDICATION: Antonio Grimes is a 40 year old male who was found to have bilateral hydronephrosis which was evaluated further with a CT scan. This revealed no evidence of calculi and there appeared to be obstruction at the junction between the distal and middle thirds of both ureters more prominent on the right than the left. No definite masses were noted in this area. He has had an increase in his creatinine from a baseline of 0.7 up to 1.3-1.5. He is brought to the operating room today for further evaluation with ureteroscopy and retrograde pyelography with possible biopsy and stent placement.  ANESTHESIA:  General  EBL:  Minimal  DRAINS: 6 French 26 cm double-J stent in the right ureter and a 6 French, 24 cm double-J stent in the left ureter (no string)  LOCAL MEDICATIONS USED:  None  SPECIMEN:  Brush biopsy from right ureter.  Description of procedure: After informed consent the patient was taken to the operating room and placed on the table in a supine position. General anesthesia was then administered. Once fully anesthetized the patient was moved to the dorsal lithotomy position and the genitalia were sterilely prepped and draped in standard fashion. An official timeout was then performed.  Initially the 23 French cystoscope was passed under direct vision down the urethra which was noted be entirely normal. The prostatic urethra was also noted to be normal and nonobstructing. Upon entering the bladder it was fully and systematically inspected and noted be free of any tumors, stones or inflammatory lesions. Ureteral orifices were noted to be of normal configuration and  position.  A 6 French open-ended ureteral catheter was then passed through the rigid cystoscope and into the right ureteral orifice in order to perform a right retrograde pyelogram. The retrograde pyelogram was performed by injecting full-strength Omnipaque contrast through the open-ended catheter and up the right ureter under direct fluoroscopy. This revealed a normal appearing distal ureter with an area of tortuosity and what appeared to be almost valve-like configuration at the area of obstruction with dilatation of the ureter proximal to this and a dilated renal pelvis and calyces with no filling defects noted above this area.  A .038 inch floppy-tipped guidewire was then passed through the open-ended catheter and up the ureter into the area the renal pelvis under fluoroscopy and was left in place and the cystoscope removed. The inner portion of a ureteral access sheath was then used to gently dilate the intramural ureter after which a 6 French rigid ureteroscope was then passed under direct vision up the right ureter. I found there was difficulty advancing the scope so I removed it and passed it over the guidewire and was able to pass this on up through the area where there appeared to be obstruction and into an area of dilated but normal-appearing ureter. I then backed the scope down the ureter and noted almost a valve-like configuration that appeared to be due to some form of extrinsic compression as there were no lesions noted within the ureter itself. I tried to obtain biopsies from this location but was unsuccessful with the small biopsy forceps so I used a brush biopsy and obtained a brush biopsy from the ureter at this location. I then advanced the guidewire back through  the ureteroscope and into the renal pelvis and removed the ureteroscope and backloaded the cystoscope.  I then passed the double-J stent over the guidewire into the area the renal pelvis under fluoroscopy and as a remove the  guidewire good curl was noted in the renal pelvis and in the bladder.  Attention was then directed to the left ureter and a left retrograde pyelogram was performed in an identical fashion. Similar but less significant findings were noted on the left-hand side with no definite filling defect but rather some irregularity in the configuration of the ureter at about the same location up the ureter as the contralateral side. I watched the contrast as some passed down the ureter but he still maintained contrast within the area of the renal pelvis and upper collecting system for some time and this did not drain out indicating that there was some mild degree of obstruction on this side as well. I then dilated the intramural ureter with the ureteral access sheath and passed the ureteroscope over the guidewire up the left ureter and again noted smooth appearing mucosa with the configuration quite similar to the contralateral side. This was photographed. I did not feel a biopsy was necessary and therefore the guidewire was passed through this area and into the area the renal pelvis and the ureteroscope removed and the stent placed in an identical fashion as above. I then drained the bladder and instilled 2% lidocaine in the urethra and applied a penile clamp and the patient was awakened and taken to the recovery room in stable and satisfactory condition. He tolerated procedure well no intraoperative complications. PLAN OF CARE: Discharge to home after PACU  PATIENT DISPOSITION:  PACU - hemodynamically stable.

## 2015-12-31 NOTE — Transfer of Care (Signed)
Last Vitals:  Vitals:   12/31/15 1145  BP: 137/89  Pulse: 70  Resp: 16  Temp: 37.1 C    Last Pain:  Vitals:   12/31/15 1145  TempSrc: Oral      Patients Stated Pain Goal: 6 (12/31/15 1156)  Immediate Anesthesia Transfer of Care Note  Patient: Antonio Grimes  Procedure(s) Performed: Procedure(s) (LRB): CYSTOSCOPY/RETROGRADE/URETEROSCOPY/RIGHT URETERAL  BIOPSY/BILATERAL STENT PLACEMENT (Bilateral)  Patient Location: PACU  Anesthesia Type: General  Level of Consciousness: awake, alert  and oriented  Airway & Oxygen Therapy: Patient Spontanous Breathing and Patient connected to nasal cannula oxygen  Post-op Assessment: Report given to PACU RN and Post -op Vital signs reviewed and stable  Post vital signs: Reviewed and stable  Complications: No apparent anesthesia complications

## 2015-12-31 NOTE — H&P (Signed)
Antonio Grimes is a 40 year-old male with bilateral hydronephrosis.  His problem was diagnosed approximately 12/09/2015. He had the following imaging studies done: Renal Ultrasound and CT Scan.   He is not currently having flank pain, back pain, groin pain, nausea, vomiting, fever or chills.   He has not had kidney surgery. He has not had a stent placed in his kidney. He has not received radiation therapy.   In 4/17 he reported abdominal pain and was evaluated with a renal ultrasound on 08/23/15 which revealed moderate right hydronephrosis without evidence of calculi and moderate hydronephrosis on the left side as well. Simple cysts were noted within the left kidney as well.   He reports he is having no flank pain or voiding symptoms.     ALLERGIES: No Allergies    MEDICATIONS: Metoprolol Succinate 25 mg tablet, extended release 24 hr  Multivitamins TABS Oral  Pantoprazole Sodium 40 mg tablet, delayed release     GU PSH: Locm 300-399Mg /Ml Iodine,1Ml - 12/13/2015 Vasectomy - 2010      PSH Notes: Inquiry And Counseling: Contraceptive Practices, Surgery Of Male Genitalia Vasectomy   NON-GU PSH: None   GU PMH: Renal Cysts, Simple - 12/17/2015 Disorder Kidney/ureter, Unspec, He does appear to have some mild renal insufficiency and I will therefore recheck his creatinine today. His CT scan will allow me to determine if his hydronephrosis is contributing to this. - 12/08/2015 Hydronephrosis Unspec, Bilateral, I have discussed with him the need to evaluate this further. It may be congenital and could possibly be related to reflux. A source of obstruction needs to be ruled out as well and in addition his renal function needs to be evaluated. - 12/08/2015      PMH Notes: Bilateral hydronephrosis: In 4/17 he reported abdominal pain and was evaluated with a renal ultrasound on 08/23/15 which revealed moderate right hydronephrosis without evidence of calculi and moderate hydronephrosis on the left side as  well. Simple cysts were noted within the left kidney as well.    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  Social Drinker.  Drinks 2 caffeinated drinks per day.     Notes: Caffeine Use, Marital History - Currently Married, Alcohol Use, Tobacco Use   REVIEW OF SYSTEMS:    GU Review Male:   Patient denies frequent urination, hard to postpone urination, burning/ pain with urination, get up at night to urinate, leakage of urine, stream starts and stops, trouble starting your stream, have to strain to urinate , erection problems, and penile pain.  Gastrointestinal (Upper):   Patient denies nausea, vomiting, and indigestion/ heartburn.  Gastrointestinal (Lower):   Patient denies diarrhea and constipation.  Constitutional:   Patient denies fever, night sweats, weight loss, and fatigue.  Skin:   Patient denies skin rash/ lesion and itching.  Eyes:   Patient denies blurred vision and double vision.  Ears/ Nose/ Throat:   Patient denies sinus problems and sore throat.  Hematologic/Lymphatic:   Patient denies swollen glands and easy bruising.  Cardiovascular:   Patient denies leg swelling and chest pains.  Respiratory:   Patient denies cough and shortness of breath.  Endocrine:   Patient denies excessive thirst.  Musculoskeletal:   Patient denies back pain and joint pain.  Neurological:   Patient  denies headaches and dizziness.  Psychologic:   Patient denies depression and anxiety.   VITAL SIGNS:      Weight 195 lb / 88.45 kg  Height 68 in / 172.72 cm  BP 127/90 mmHg  Pulse 80 /min  BMI 29.6 kg/m   GU PHYSICAL EXAMINATION:    Anus and Perineum: No hemorrhoids. No anal stenosis. No rectal fissure, no anal fissure. No edema, no dimple, no perineal  tenderness, no anal tenderness.  Scrotum: No lesions. No edema. No cysts. No warts.  Epididymides: Right: no spermatocele, no masses, no cysts, no tenderness, no induration, no enlargement. Left: no spermatocele, no masses, no cysts, no tenderness, no induration, no enlargement.  Testes: No tenderness, no swelling, no enlargement left testes. No tenderness, no swelling, no enlargement right testes. Normal location left testes. Normal location right testes. No mass, no cyst, no varicocele, no hydrocele left testes. No mass, no cyst, no varicocele, no hydrocele right testes.  Urethral Meatus: Normal size. No lesion, no wart, no discharge, no polyp. Normal location.  Penis: Circumcised, no warts, no cracks. No dorsal Peyronie's plaques, no left corporal Peyronie's plaques, no right corporal Peyronie's plaques, no scarring, no warts. No balanitis, no meatal stenosis.  Prostate: 40 gram or 2+ size. Left lobe normal consistency, right lobe normal consistency. Symmetrical lobes. No prostate nodule. Left lobe no tenderness, right lobe no tenderness.  Seminal Vesicles: Nonpalpable.  Sphincter Tone: Normal sphincter. No rectal tenderness. No rectal mass.    MULTI-SYSTEM PHYSICAL EXAMINATION:    Constitutional: Well-nourished. No physical deformities. Normally developed. Good grooming.  Neck: Neck symmetrical, not swollen. Normal tracheal position.  Respiratory: No labored breathing, no use of accessory muscles.   Cardiovascular: Normal temperature, normal extremity pulses, no swelling, no varicosities.  Lymphatic: No enlargement of neck, axillae, groin.  Skin: No paleness, no jaundice, no cyanosis. No lesion, no ulcer, no rash.  Neurologic / Psychiatric: Oriented to time, oriented to place, oriented to person. No depression, no anxiety, no agitation.  Gastrointestinal: No mass, no tenderness, no rigidity, non obese abdomen.  Eyes: Normal conjunctivae. Normal eyelids.  Ears, Nose, Mouth, and Throat: Left  ear no scars, no lesions, no masses. Right ear no scars, no lesions, no masses. Nose no scars, no lesions, no masses. Normal hearing. Normal lips.  Musculoskeletal: Normal gait and station of head and neck.    PAST DATA REVIEWED:  Source Of History:  Patient, Outside Source  Records Review:   Previous Patient Records, POC Tool  X-Ray Review: C.T. Abdomen/Pelvis: Reviewed Films. Reviewed Report. Discussed With Patient. Findings as below    PROCEDURES:          Urinalysis - 81003 Dipstick Dipstick Cont'd  Specimen: Voided Bilirubin: Neg  Color: Yellow Ketones: Neg  Appearance: Clear Blood: Neg  Specific Gravity: 1.010 Protein: Neg  pH: 7.0 Urobilinogen: 0.2  Glucose: Neg Nitrites: Neg    Leukocyte Esterase: Neg    ASSESSMENT:  His CT scan has revealed what appears to be bilateral extrinsic ureteral obstruction. While this is seen in cases of retroperitoneal fibrosis he does not seem to have any risk factors for this condition. This is an idiopathic condition in many cases. It appears to be causing some obstruction of the ureters bilaterally but more so on the right hand side. There appeared to be delay in function on the right hand side indicating some degree of obstruction with no definite obstruction seen on the left-hand side. I need to be absolutely sure that there is no  intrinsic abnormality of the ureter especially on the right hand side and therefore have recommended further evaluation with cystoscopy, bilateral retrograde pyelograms, right ureteroscopy with possible biopsy and stent placement. I went over the procedure with him in detail including the risks and complications, the alternatives, the outpatient nature of the procedure as well as the anticipated postoperative course and probability of success.      I note that he was on a beta blocker which in rare cases has been associated with secondary retroperitoneal fibrosis. He stopped taking the medication a few months ago and I  recommended he remain off of that now.    PLAN:   Cystoscopy with bilateral retrograde pyelograms, ureteroscopy, possible biopsy and stent placement.

## 2015-12-31 NOTE — Discharge Instructions (Signed)
Post stent placement instructions ° ° °Definitions: ° °Ureter: The duct that transports urine from the kidney to the bladder. °Stent: A plastic hollow tube that is placed into the ureter, from the kidney to the bladder to prevent the ureter from swelling shut. ° °General instructions: ° °Despite the fact that no skin incisions were used, the area around the ureter and bladder is raw and irritated. The stent is a foreign body which can further irritate the bladder wall. This irritation is manifested by increased frequency of urination, both day and night, and by an increase in the urge to urinate. In some, the urge to urinate is present almost always. Sometimes the urge is strong enough that you may not be able to stop your self from urinating. This can often be controlled with medication but does not occur in everyone. A stent can safely be left in place for 3 months or greater. ° °You may see some blood in your urine while the stent is in place and a few days afterward. Do not be alarmed, even if the urine is clear for a while. Get off your feet and drink lots of fluids until clearing occurs. If you start to pass clots or don't improve, call us. ° °Diet: ° °You may return to your normal diet immediately. Because of the raw surface of your bladder, alcohol, spicy foods, foods high in acid and drinks with caffeine may cause irritation or frequency and should be used in moderation. To keep your urine flowing freely and avoid constipation, drink plenty of fluids during the day (8-10 glasses). Tip: Avoid cranberry juice because it is very acidic. ° °Activity: ° °Your physical activity doesn't need to be restricted. However, if you are very active, you may see some blood in the urine. We suggest that you reduce your activity under the circumstances until the bleeding has stopped. ° °Bowels: ° °It is important to keep your bowels regular during the postoperative period. Straining with bowel movements can cause bleeding. A  bowel movement every other day is reasonable. Use a mild laxative if needed, such as milk of magnesia 2-3 tablespoons, or 2 Dulcolax tablets. Call if you continue to have problems. If you had been taking narcotics for pain, before, during or after your surgery, you may be constipated. Take a laxative if necessary. ° °Medication: ° °You should resume your pre-surgery medications unless told not to. In addition you may be given an antibiotic to prevent or treat infection. Antibiotics are not always necessary. All medication should be taken as prescribed until the bottles are finished unless you are having an unusual reaction to one of the drugs. ° °Problems you should report to us: ° °a. Fever greater than 101°F. °b. Heavy bleeding, or clots (see notes above about blood in urine). °c. Inability to urinate. °d. Drug reactions (hives, rash, nausea, vomiting, diarrhea). °e. Severe burning or pain with urination that is not improving. ° ° °Post Anesthesia Home Care Instructions ° °Activity: °Get plenty of rest for the remainder of the day. A responsible adult should stay with you for 24 hours following the procedure.  °For the next 24 hours, DO NOT: °-Drive a car °-Operate machinery °-Drink alcoholic beverages °-Take any medication unless instructed by your physician °-Make any legal decisions or sign important papers. ° °Meals: °Start with liquid foods such as gelatin or soup. Progress to regular foods as tolerated. Avoid greasy, spicy, heavy foods. If nausea and/or vomiting occur, drink only clear liquids until the nausea   and/or vomiting subsides. Call your physician if vomiting continues. ° °Special Instructions/Symptoms: °Your throat may feel dry or sore from the anesthesia or the breathing tube placed in your throat during surgery. If this causes discomfort, gargle with warm salt water. The discomfort should disappear within 24 hours. ° °If you had a scopolamine patch placed behind your ear for the management of  post- operative nausea and/or vomiting: ° °1. The medication in the patch is effective for 72 hours, after which it should be removed.  Wrap patch in a tissue and discard in the trash. Wash hands thoroughly with soap and water. °2. You may remove the patch earlier than 72 hours if you experience unpleasant side effects which may include dry mouth, dizziness or visual disturbances. °3. Avoid touching the patch. Wash your hands with soap and water after contact with the patch. °  ° ° ° ° °

## 2015-12-31 NOTE — Anesthesia Procedure Notes (Signed)
Procedure Name: LMA Insertion Date/Time: 12/31/2015 1:12 PM Performed by: Karlyne GreenspanJUDD, BENJAMIN Pre-anesthesia Checklist: Patient identified, Emergency Drugs available, Suction available and Patient being monitored Patient Re-evaluated:Patient Re-evaluated prior to inductionOxygen Delivery Method: Circle system utilized Preoxygenation: Pre-oxygenation with 100% oxygen Intubation Type: IV induction Ventilation: Mask ventilation without difficulty LMA: LMA inserted LMA Size: 4.0 Number of attempts: 1 Airway Equipment and Method: Bite block Placement Confirmation: positive ETCO2 Tube secured with: Tape Dental Injury: Teeth and Oropharynx as per pre-operative assessment

## 2016-01-03 ENCOUNTER — Encounter (HOSPITAL_BASED_OUTPATIENT_CLINIC_OR_DEPARTMENT_OTHER): Payer: Self-pay | Admitting: Urology

## 2016-05-18 ENCOUNTER — Other Ambulatory Visit (HOSPITAL_COMMUNITY): Payer: Self-pay | Admitting: Urology

## 2016-05-18 DIAGNOSIS — N1339 Other hydronephrosis: Secondary | ICD-10-CM

## 2016-06-07 ENCOUNTER — Ambulatory Visit (HOSPITAL_COMMUNITY)
Admission: RE | Admit: 2016-06-07 | Discharge: 2016-06-07 | Disposition: A | Discharge status: Home / Self Care | Payer: Managed Care, Other (non HMO) | Source: Ambulatory Visit | Attending: Urology | Admitting: Urology

## 2016-06-07 DIAGNOSIS — N1330 Unspecified hydronephrosis: Secondary | ICD-10-CM | POA: Insufficient documentation

## 2016-06-07 DIAGNOSIS — N1339 Other hydronephrosis: Secondary | ICD-10-CM

## 2016-06-07 MED ORDER — FUROSEMIDE 10 MG/ML IJ SOLN: 45.0000 mg | Freq: Once | INTRAMUSCULAR | Status: DC

## 2016-06-07 MED ORDER — FUROSEMIDE 10 MG/ML IJ SOLN
INTRAMUSCULAR | Status: AC
  Filled 2016-06-07: qty 4

## 2016-06-07 MED ORDER — TECHNETIUM TC 99M MERTIATIDE
4.9300 | Freq: Once | INTRAVENOUS | Status: AC | PRN
  Administered 2016-06-07: 4.93 via INTRAVENOUS

## 2016-07-07 ENCOUNTER — Other Ambulatory Visit: Payer: Self-pay | Admitting: Urology

## 2016-07-18 NOTE — Progress Notes (Signed)
EKG 12-31-15 epic

## 2016-07-18 NOTE — Patient Instructions (Addendum)
Antonio Grimes  07/18/2016   Your procedure is scheduled on: 07-24-16  [facility]  [facility] 3rd floor to  Short Stay Center at [phone].  Call this number if you have problems the morning of surgery [phone]   Remember: ONLY 1 PERSON MAY GO WITH YOU TO SHORT STAY TO GET  READY MORNING OF YOUR SURGERY.  Do not eat food After Midnight Saturday night 07-22-16. Drink plenty of clear liquids all day Sunday 07-23-16 and follow bowel prep instructions from Dr. Vevelyn RoyalsBorden's office. Nothing by mouth after midnight Sunday night!!    Take these medicines the morning of surgery with A SIP OF WATER: levothyroxine(synthroid), eye drops as needed                                You may not have any metal on your body including hair pins and              piercings  Do not wear jewelry, make-up, lotions, powders or perfumes, deodorant             Do not wear nail polish.  Do not shave  48 hours prior to surgery.              Men may shave face and neck.   Do not bring valuables to the hospital. [place] IS NOT             RESPONSIBLE   FOR VALUABLES.  Contacts, dentures or bridgework may not be worn into surgery.  Leave suitcase in the car. After surgery it may be brought to your room.                Please read over the following fact sheets you were given: _____________________________________________________________________                CLEAR LIQUID DIET   Foods Allowed                                                                     Foods Excluded  Coffee and tea, regular and decaf                             liquids that you cannot  Plain Jell-O in any flavor                                             see through such as: Fruit ices (not with fruit pulp)                                     milk, soups, orange juice  Iced Popsicles  All solid food Carbonated beverages, regular and diet                                     Cranberry, grape and apple juices Sports drinks like Gatorade Lightly seasoned clear broth or consume(fat free) Sugar, honey syrup  Sample Menu Breakfast                                Lunch                                     Supper Cranberry juice                    Beef broth                            Chicken broth Jell-O                                     Grape juice                           Apple juice Coffee or tea                        Jell-O                                      Popsicle                                                Coffee or tea                        Coffee or tea  _____________________________________________________________________  [facility] - Preparing for Surgery Before surgery, you can play an important role.  Because skin is not sterile, your skin needs to be as free of germs as possible.  You can reduce the number of germs on your skin by washing with CHG (chlorahexidine gluconate) soap before surgery.  CHG is an antiseptic cleaner which kills germs and bonds with the skin to continue killing germs even after washing. Please DO NOT use if you have an allergy to CHG or antibacterial soaps.  If your skin becomes reddened/irritated stop using the CHG and inform your nurse when you arrive at Short Stay. Do not shave (including legs and underarms) for at least 48 hours prior to the first CHG shower.  You may shave your face/neck. Please follow these instructions carefully:  1.  Shower with CHG Soap the night before surgery and the  morning of Surgery.  2.  If you choose to wash your hair, wash your hair first as usual with your  normal  shampoo.  3.  After you shampoo, rinse your hair and body thoroughly to remove the  shampoo.  4.  Use CHG as you would any other liquid soap.  You can apply chg directly  to the skin and wash                       Gently with a scrungie or clean washcloth.  5.  Apply  the CHG Soap to your body ONLY FROM THE NECK DOWN.   Do not use on face/ open                           Wound or open sores. Avoid contact with eyes, ears mouth and genitals (private parts).                       Wash face,  Genitals (private parts) with your normal soap.             6.  Wash thoroughly, paying special attention to the area where your surgery  will be performed.  7.  Thoroughly rinse your body with warm water from the neck down.  8.  DO NOT shower/wash with your normal soap after using and rinsing off  the CHG Soap.                9.  Pat yourself dry with a clean towel.            10.  Wear clean pajamas.            11.  Place clean sheets on your bed the night of your first shower and do not  sleep with pets. Day of Surgery : Do not apply any lotions/deodorants the morning of surgery.  Please wear clean clothes to the hospital/surgery center.  FAILURE TO FOLLOW THESE INSTRUCTIONS MAY RESULT IN THE CANCELLATION OF YOUR SURGERY PATIENT SIGNATURE_________________________________  NURSE SIGNATURE__________________________________  ________________________________________________________________________   Adam Phenix  An incentive spirometer is a tool that can help keep your lungs clear and active. This tool measures how well you are filling your lungs with each breath. Taking long deep breaths may help reverse or decrease the chance of developing breathing (pulmonary) problems (especially infection) following:  A long period of time when you are unable to move or be active. BEFORE THE PROCEDURE   If the spirometer includes an indicator to show your best effort, your nurse or respiratory therapist will set it to a desired goal.  If possible, sit up straight or lean slightly forward. Try not to slouch.  Hold the incentive spirometer in an upright position. INSTRUCTIONS FOR USE  1. Sit on the edge of your bed if possible, or sit up as far as you can in bed or on  a chair. 2. Hold the incentive spirometer in an upright position. 3. Breathe out normally. 4. Place the mouthpiece in your mouth and seal your lips tightly around it. 5. Breathe in slowly and as deeply as possible, raising the piston or the ball toward the top of the column. 6. Hold your breath for 3-5 seconds or for as long as possible. Allow the piston or ball to fall to the bottom of the column. 7. Remove the mouthpiece from your mouth and breathe out normally. 8. Rest for a few seconds and repeat Steps 1 through 7 at least 10 times every 1-2 hours when you are awake. Take your time and take a few normal breaths between deep breaths. 9. The spirometer may include an indicator to  show your best effort. Use the indicator as a goal to work toward during each repetition. 10. After each set of 10 deep breaths, practice coughing to be sure your lungs are clear. If you have an incision (the cut made at the time of surgery), support your incision when coughing by placing a pillow or rolled up towels firmly against it. Once you are able to get out of bed, walk around indoors and cough well. You may stop using the incentive spirometer when instructed by your caregiver.  RISKS AND COMPLICATIONS  Take your time so you do not get dizzy or light-headed.  If you are in pain, you may need to take or ask for pain medication before doing incentive spirometry. It is harder to take a deep breath if you are having pain. AFTER USE  Rest and breathe slowly and easily.  It can be helpful to keep track of a log of your progress. Your caregiver can provide you with a simple table to help with this. If you are using the spirometer at home, follow these instructions: [place] IF:   You are having difficultly using the spirometer.  You have trouble using the spirometer as often as instructed.  Your pain medication is not giving enough relief while using the spirometer.  You develop fever of 100.5 F  (38.1 C) or higher. SEEK IMMEDIATE MEDICAL CARE IF:   You cough up bloody sputum that had not been present before.  You develop fever of 102 F (38.9 C) or greater.  You develop worsening pain at or near the incision site. MAKE SURE YOU:   Understand these instructions.  Will watch your condition.  Will get help right away if you are not doing well or get worse. Document Released: 09/04/2006 Document Revised: 07/17/2011 Document Reviewed: 11/05/2006 [facility] Patient Information 2014 [place], [place].   ________________________________________________________________________

## 2016-07-20 ENCOUNTER — Ambulatory Visit (HOSPITAL_COMMUNITY)
Admission: RE | Admit: 2016-07-20 | Discharge: 2016-07-20 | Disposition: A | Discharge status: Home / Self Care | Payer: 59 | Source: Ambulatory Visit | Attending: Urology | Admitting: Urology

## 2016-07-20 ENCOUNTER — Encounter (HOSPITAL_COMMUNITY): Payer: Self-pay

## 2016-07-20 ENCOUNTER — Encounter (HOSPITAL_COMMUNITY)
Admission: RE | Admit: 2016-07-20 | Discharge: 2016-07-20 | Disposition: A | Discharge status: Home / Self Care | Payer: 59 | Source: Ambulatory Visit | Attending: Urology | Admitting: Urology

## 2016-07-20 DIAGNOSIS — R938 Abnormal findings on diagnostic imaging of other specified body structures: Secondary | ICD-10-CM | POA: Insufficient documentation

## 2016-07-20 DIAGNOSIS — Z01812 Encounter for preprocedural laboratory examination: Secondary | ICD-10-CM | POA: Insufficient documentation

## 2016-07-20 DIAGNOSIS — R9389 Abnormal findings on diagnostic imaging of other specified body structures: Secondary | ICD-10-CM

## 2016-07-20 DIAGNOSIS — Z01810 Encounter for preprocedural cardiovascular examination: Secondary | ICD-10-CM | POA: Diagnosis present

## 2016-07-20 LAB — BASIC METABOLIC PANEL
ANION GAP: 6 (ref 5–15)
BUN: 14 mg/dL (ref 6–20)
CALCIUM: 9.5 mg/dL (ref 8.9–10.3)
CO2: 28 mmol/L (ref 22–32)
Chloride: 105 mmol/L (ref 101–111)
Creatinine, Ser: 1.27 mg/dL — ABNORMAL HIGH (ref 0.61–1.24)
GLUCOSE: 93 mg/dL (ref 65–99)
Potassium: 4.4 mmol/L (ref 3.5–5.1)
Sodium: 139 mmol/L (ref 135–145)

## 2016-07-20 LAB — CBC
HCT: 43.2 % (ref 39.0–52.0)
Hemoglobin: 14.9 g/dL (ref 13.0–17.0)
MCH: 27.3 pg (ref 26.0–34.0)
MCHC: 34.5 g/dL (ref 30.0–36.0)
MCV: 79.1 fL (ref 78.0–100.0)
PLATELETS: 203 10*3/uL (ref 150–400)
RBC: 5.46 MIL/uL (ref 4.22–5.81)
RDW: 13 % (ref 11.5–15.5)
WBC: 5.4 10*3/uL (ref 4.0–10.5)

## 2016-07-20 LAB — ABO/RH: ABO/RH(D): O NEG

## 2016-07-21 NOTE — H&P (Signed)
CC/HPI: Ureteral obstruction    He developed generalized abdominal pain that was most notable in the epigastric region in the postprandial setting in February 2017 and persisted until April 2017. He underwent a GI evaluation by Dr. Evette CristalGanem which included endoscopy which reportedly was unremarkable. However, he did undergo an abdominal ultrasound as part of his evaluation which incidentally noted bilateral hydronephrosis. His serum creatinine was also noted to be elevated by his PCP, Dr. Ananias PilgrimVaughan, at 1.6. According to records, his baseline creatinine had been as low as 0.7 or 0.8 in the past. He was eventually sent for urologic consultation and was seen by Dr. Vernie Ammonsttelin in early August 2017. He underwent a CT scan of the abdomen and pelvis with and without IV contrast. This confirmed bilateral hydroureteronephrosis with evidence of a delayed right-sided nephrogram consistent with obstruction. The severity of the dilation on the right side was also significantly greater on the right compared to left. There did appear to be a clear transition point just above the level of the iliac vessels where the ureter transition to a normal caliber. In this area, there did appear to be a hyperdense, circumferential area of tissue surrounding the ureter. On the left side, there also was a distinct although less severe transition point with surrounding tissue although the tissue on the left appeared to be of fat density as opposed to a hyperdense lesion or tissue. He was then taken to the operating room by Dr. Vernie Ammonsttelin on 12/31/15 for further evaluation which included bilateral retrograde pyelography, right ureteroscopy with brush biopsy, and bilateral ureteral stent placement. His serum creatinine prior to this procedure was 1.3. His brush biopsy came back as negative for malignancy. Intraoperative findings confirmed retrograde pyelograms that were very consistent with his CT findings. Right ureteroscopy also confirmed no  evidence of intrinsic abnormalities within the ureters to suggest a urothelial malignancy. His serum creatinine following ureteral stent placement remained stable at 1.3-1.4. 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  A follow up CT scan looked somewhat improved and he elected stent removal and follow up imaging.  His serum creatinine was 1.0 at baseline prior to stent removal it was noted to be 1.3 week after stent removal.    He has been noted to know have persistent obstruction on the right side since stent removal.  He also has diminished right relative renal function on renogram.  He was recommended to proceed with surgical intervention.  Notably, the small mass like area around the right ureter has not been amenable to percutaneous biopsy.  ALLERGIES: No Allergies    MEDICATIONS: Levothyroxine Sodium  Amlodipine Besylate 5 mg tablet  Multivitamins TABS Oral  Naltrexone Hcl     GU PSH: Cysto Remove Stent FB Sim - 05/16/2016 Cysto Uretero Biopsy Fulgura, Right - 12/31/2015 Cystoscopy Insert Stent, Bilateral - 12/31/2015 Locm 300-399Mg /Ml Iodine,1Ml - 04/14/2016 Vasectomy - 2010      PSH Notes: Inquiry And Counseling: Contraceptive Practices, Surgery Of Male Genitalia Vasectomy   NON-GU PSH: None   GU PMH: Oth hydronephrosis      PMH Notes:    NON-GU PMH: Hyperthyroidism    FAMILY HISTORY: Primary cancer of lesser curve of stomach - Father Prostate Cancer - Father   SOCIAL HISTORY: Marital Status: Married Current Smoking Status: Patient does not smoke anymore.  Social Drinker.  Drinks 2 caffeinated drinks per day.     Notes: Caffeine Use, Marital History - Currently Married, Alcohol  Use, Tobacco Use   REVIEW OF SYSTEMS:    GU Review Male:   Patient denies frequent urination, hard to postpone urination, burning/ pain with urination, get up at night to urinate, leakage of urine, stream starts and stops, trouble  starting your streams, and have to strain to urinate .  Gastrointestinal (Lower):   Patient denies diarrhea and constipation.  Gastrointestinal (Upper):   Patient denies vomiting and nausea.  Constitutional:   Patient denies fever, night sweats, weight loss, and fatigue.  Skin:   Patient denies skin rash/ lesion and itching.  Eyes:   Patient denies blurred vision and double vision.  Ears/ Nose/ Throat:   Patient denies sore throat and sinus problems.  Hematologic/Lymphatic:   Patient denies swollen glands and easy bruising.  Cardiovascular:   Patient denies leg swelling and chest pains.  Respiratory:   Patient denies cough and shortness of breath.  Endocrine:   Patient denies excessive thirst.  Musculoskeletal:   Patient denies back pain and joint pain.  Neurological:   Patient denies headaches and dizziness.  Psychologic:   Patient denies depression and anxiety.   VITAL SIGNS:     Weight 195 lb / 88.45 kg  Height 68 in / 172.72 cm  BMI 29.6 kg/m   MULTI-SYSTEM PHYSICAL EXAMINATION:    Constitutional: Well-nourished. No physical deformities. Normally developed. Good grooming.  Neck: Neck symmetrical, not swollen. Normal tracheal position.  Respiratory: No labored breathing, no use of accessory muscles. Clear bilaterally.  Cardiovascular: Normal temperature, normal extremity pulses, no swelling, no varicosities. Regular rate and rhythm.  Lymphatic: No enlargement of neck, axillae, groin.  Skin: No paleness, no jaundice, no cyanosis. No lesion, no ulcer, no rash.  Neurologic / Psychiatric: Oriented to time, oriented to place, oriented to person. No depression, no anxiety, no agitation.  Gastrointestinal: No mass, no tenderness, no rigidity, non obese abdomen.  Eyes: Normal conjunctivae. Normal eyelids.  Ears, Nose, Mouth, and Throat: Left ear no scars, no lesions, no masses. Right ear no scars, no lesions, no masses. Nose no scars, no lesions, no masses. Normal hearing. Normal lips.   Musculoskeletal: Normal gait and station of head and neck.     PAST DATA REVIEWED:  Source Of History:  Patient  Urine Test Review:   Urinalysis  X-Ray Review: Lasix Renogram: Reviewed Films. Reviewed Report. I independently reviewed his Lasix renal scan. This demonstrates 25% relative renal function on the right and 75% relative renal function left. He appears to have adequate washout on the left side indicating that he is not obstructed on that side. However, he does not have any significant washout on the right side indicating persistent obstruction.       ASSESSMENT:   1 GU:   Oth hydronephrosis - N13.39    PLAN:      1. Right ureteral obstruction: He will undergo cystoscopy, bilateral retrograde pyelography, right ureteral stent placement and possible left ureteral stent placement. We'll plan to at least proceed with a right robot-assisted laparoscopic ureterolysis with omental wrap. If there is concern for a mass lesion, this also will be biopsied with an excisional biopsy. We also will assess the left side intraoperatively and on left retrograde pyelography to determine if there would be any benefit to left ureterolysis although his renogram would suggest that this would be unnecessary.  I discussed the potential benefits and risks of the procedure, side effects of the proposed treatment, the likelihood of the patient achieving the goals of the procedure, and any potential  problems that might occur during the procedure or recuperation.

## 2016-07-23 NOTE — Anesthesia Preprocedure Evaluation (Addendum)
Anesthesia Evaluation  Patient identified by MRN, date of birth, ID band Patient awake    Reviewed: Allergy & Precautions, NPO status , Patient's Chart, lab work & pertinent test results  Airway Mallampati: II  TM Distance: >3 FB Neck ROM: Full    Dental  (+) Dental Advisory Given   Pulmonary neg pulmonary ROS,    breath sounds clear to auscultation       Cardiovascular hypertension, Pt. on medications  Rhythm:Regular Rate:Normal     Neuro/Psych negative neurological ROS     GI/Hepatic negative GI ROS, Neg liver ROS,   Endo/Other  Hypothyroidism   Renal/GU Renal disease     Musculoskeletal   Abdominal   Peds  Hematology negative hematology ROS (+)   Anesthesia Other Findings   Reproductive/Obstetrics                            Lab Results  Component Value Date   WBC 5.4 07/20/2016   HGB 14.9 07/20/2016   HCT 43.2 07/20/2016   MCV 79.1 07/20/2016   PLT 203 07/20/2016   Lab Results  Component Value Date   CREATININE 1.27 (H) 07/20/2016   BUN 14 07/20/2016   NA 139 07/20/2016   K 4.4 07/20/2016   CL 105 07/20/2016   CO2 28 07/20/2016    Anesthesia Physical Anesthesia Plan  ASA: II  Anesthesia Plan: General   Post-op Pain Management:    Induction: Intravenous  Airway Management Planned: Oral ETT  Additional Equipment:   Intra-op Plan:   Post-operative Plan: Extubation in OR  Informed Consent: I have reviewed the patients History and Physical, chart, labs and discussed the procedure including the risks, benefits and alternatives for the proposed anesthesia with the patient or authorized representative who has indicated his/her understanding and acceptance.   Dental advisory given  Plan Discussed with:   Anesthesia Plan Comments:        Anesthesia Quick Evaluation

## 2016-07-24 ENCOUNTER — Inpatient Hospital Stay (HOSPITAL_COMMUNITY): Payer: 59 | Admitting: Certified Registered Nurse Anesthetist

## 2016-07-24 ENCOUNTER — Encounter (HOSPITAL_COMMUNITY)
Admission: RE | Disposition: A | Discharge status: Home / Self Care | Payer: Self-pay | Source: Ambulatory Visit | Attending: Urology

## 2016-07-24 ENCOUNTER — Inpatient Hospital Stay (HOSPITAL_COMMUNITY): Payer: 59

## 2016-07-24 ENCOUNTER — Observation Stay (HOSPITAL_COMMUNITY)
Admission: RE | Admit: 2016-07-24 | Discharge: 2016-07-25 | Disposition: A | Discharge status: Home / Self Care | Payer: 59 | Source: Ambulatory Visit | Attending: Urology | Admitting: Urology

## 2016-07-24 ENCOUNTER — Encounter (HOSPITAL_COMMUNITY): Payer: Self-pay

## 2016-07-24 DIAGNOSIS — E0500 Thyrotoxicosis with diffuse goiter without thyrotoxic crisis or storm: Secondary | ICD-10-CM | POA: Diagnosis not present

## 2016-07-24 DIAGNOSIS — K682 Retroperitoneal fibrosis: Secondary | ICD-10-CM | POA: Diagnosis present

## 2016-07-24 DIAGNOSIS — Z87891 Personal history of nicotine dependence: Secondary | ICD-10-CM | POA: Insufficient documentation

## 2016-07-24 DIAGNOSIS — N131 Hydronephrosis with ureteral stricture, not elsewhere classified: Principal | ICD-10-CM | POA: Insufficient documentation

## 2016-07-24 DIAGNOSIS — I10 Essential (primary) hypertension: Secondary | ICD-10-CM | POA: Insufficient documentation

## 2016-07-24 DIAGNOSIS — N135 Crossing vessel and stricture of ureter without hydronephrosis: Secondary | ICD-10-CM | POA: Diagnosis present

## 2016-07-24 DIAGNOSIS — Z79899 Other long term (current) drug therapy: Secondary | ICD-10-CM | POA: Diagnosis not present

## 2016-07-24 DIAGNOSIS — Z419 Encounter for procedure for purposes other than remedying health state, unspecified: Secondary | ICD-10-CM

## 2016-07-24 HISTORY — PX: CYSTOSCOPY W/ RETROGRADES: SHX1426

## 2016-07-24 HISTORY — PX: ROBOTIC ASSISTED LAPAROSCOPIC URETEROSTOMY / NEOCYSTOSTOMY: SHX6644

## 2016-07-24 LAB — TYPE AND SCREEN
ABO/RH(D): O NEG
Antibody Screen: NEGATIVE

## 2016-07-24 LAB — BASIC METABOLIC PANEL
Anion gap: 6 (ref 5–15)
BUN: 15 mg/dL (ref 6–20)
CHLORIDE: 104 mmol/L (ref 101–111)
CO2: 27 mmol/L (ref 22–32)
CREATININE: 1.45 mg/dL — AB (ref 0.61–1.24)
Calcium: 8.8 mg/dL — ABNORMAL LOW (ref 8.9–10.3)
GFR calc Af Amer: >60 mL/min (ref >60)
GFR, EST NON AFRICAN AMERICAN: 59 mL/min — AB (ref >60)
Glucose, Bld: 130 mg/dL — ABNORMAL HIGH (ref 65–99)
Potassium: 4 mmol/L (ref 3.5–5.1)
SODIUM: 137 mmol/L (ref 135–145)

## 2016-07-24 LAB — HEMOGLOBIN AND HEMATOCRIT, BLOOD
HCT: 40.7 % (ref 39.0–52.0)
HEMOGLOBIN: 13.9 g/dL (ref 13.0–17.0)

## 2016-07-24 SURGERY — ROBOTIC ASSISTED LAPAROSCOPIC URETEROSTOMY / NEOCYSTOSTOMY
Anesthesia: General | Laterality: Right

## 2016-07-24 MED ORDER — HYPROMELLOSE (GONIOSCOPIC) 2.5 % OP SOLN: 1.0000 [drp] | Freq: Three times a day (TID) | OPHTHALMIC | Status: DC | PRN

## 2016-07-24 MED ORDER — ROCURONIUM BROMIDE 50 MG/5ML IV SOSY
PREFILLED_SYRINGE | INTRAVENOUS | Status: AC
  Filled 2016-07-24: qty 5

## 2016-07-24 MED ORDER — ONDANSETRON HCL 4 MG/2ML IJ SOLN: 4.0000 mg | INTRAMUSCULAR | Status: DC | PRN

## 2016-07-24 MED ORDER — STERILE WATER FOR IRRIGATION IR SOLN
Status: DC | PRN
  Administered 2016-07-24: 1000 mL

## 2016-07-24 MED ORDER — DOCUSATE SODIUM 100 MG PO CAPS
100.0000 mg | ORAL_CAPSULE | Freq: Two times a day (BID) | ORAL | Status: DC
  Administered 2016-07-24 – 2016-07-25 (×2): 100 mg via ORAL
  Filled 2016-07-24 (×2): qty 1

## 2016-07-24 MED ORDER — KCL IN DEXTROSE-NACL 20-5-0.45 MEQ/L-%-% IV SOLN
INTRAVENOUS | Status: DC
  Administered 2016-07-24: 22:00:00 via INTRAVENOUS
  Administered 2016-07-24: 1000 mL via INTRAVENOUS
  Filled 2016-07-24 (×2): qty 1000

## 2016-07-24 MED ORDER — ONDANSETRON HCL 4 MG/2ML IJ SOLN
INTRAMUSCULAR | Status: AC
  Filled 2016-07-24: qty 2

## 2016-07-24 MED ORDER — BUPIVACAINE LIPOSOME 1.3 % IJ SUSP
INTRAMUSCULAR | Status: DC | PRN
  Administered 2016-07-24: 20 mL

## 2016-07-24 MED ORDER — DEXAMETHASONE SODIUM PHOSPHATE 10 MG/ML IJ SOLN
INTRAMUSCULAR | Status: AC
  Filled 2016-07-24: qty 1

## 2016-07-24 MED ORDER — PROMETHAZINE HCL 25 MG/ML IJ SOLN: 6.2500 mg | INTRAMUSCULAR | Status: DC | PRN

## 2016-07-24 MED ORDER — ROCURONIUM BROMIDE 50 MG/5ML IV SOSY
PREFILLED_SYRINGE | INTRAVENOUS | Status: DC | PRN
  Administered 2016-07-24: 50 mg via INTRAVENOUS
  Administered 2016-07-24: 10 mg via INTRAVENOUS
  Administered 2016-07-24: 20 mg via INTRAVENOUS
  Administered 2016-07-24: 10 mg via INTRAVENOUS
  Administered 2016-07-24 (×2): 20 mg via INTRAVENOUS

## 2016-07-24 MED ORDER — CEFAZOLIN SODIUM-DEXTROSE 2-4 GM/100ML-% IV SOLN
INTRAVENOUS | Status: AC
  Filled 2016-07-24: qty 100

## 2016-07-24 MED ORDER — LACTATED RINGERS IR SOLN
Status: DC | PRN
  Administered 2016-07-24: 1000 mL

## 2016-07-24 MED ORDER — ACETAMINOPHEN 10 MG/ML IV SOLN
1000.0000 mg | Freq: Four times a day (QID) | INTRAVENOUS | Status: AC
Start: 2016-07-24 — End: 2016-07-25
  Administered 2016-07-24 – 2016-07-25 (×4): 1000 mg via INTRAVENOUS
  Filled 2016-07-24 (×3): qty 100

## 2016-07-24 MED ORDER — FENTANYL CITRATE (PF) 250 MCG/5ML IJ SOLN
INTRAMUSCULAR | Status: AC
  Filled 2016-07-24: qty 5

## 2016-07-24 MED ORDER — CEFAZOLIN SODIUM-DEXTROSE 2-4 GM/100ML-% IV SOLN
2.0000 g | INTRAVENOUS | Status: AC
  Administered 2016-07-24: 2 g via INTRAVENOUS

## 2016-07-24 MED ORDER — HYDROCODONE-ACETAMINOPHEN 10-325 MG PO TABS: 1.0000 | ORAL_TABLET | Freq: Four times a day (QID) | ORAL | 0 refills | Status: DC | PRN

## 2016-07-24 MED ORDER — LIDOCAINE 2% (20 MG/ML) 5 ML SYRINGE
INTRAMUSCULAR | Status: DC | PRN
  Administered 2016-07-24: 100 mg via INTRAVENOUS

## 2016-07-24 MED ORDER — PROPOFOL 10 MG/ML IV BOLUS
INTRAVENOUS | Status: AC
  Filled 2016-07-24: qty 20

## 2016-07-24 MED ORDER — OXYCODONE HCL 5 MG PO TABS: 5.0000 mg | ORAL_TABLET | ORAL | Status: DC | PRN

## 2016-07-24 MED ORDER — LACTATED RINGERS IV SOLN
INTRAVENOUS | Status: DC | PRN
  Administered 2016-07-24 (×4): via INTRAVENOUS

## 2016-07-24 MED ORDER — DEXAMETHASONE SODIUM PHOSPHATE 10 MG/ML IJ SOLN
INTRAMUSCULAR | Status: DC | PRN
  Administered 2016-07-24: 10 mg via INTRAVENOUS

## 2016-07-24 MED ORDER — SUCCINYLCHOLINE CHLORIDE 200 MG/10ML IV SOSY
PREFILLED_SYRINGE | INTRAVENOUS | Status: AC
  Filled 2016-07-24: qty 10

## 2016-07-24 MED ORDER — ACETAMINOPHEN 10 MG/ML IV SOLN
INTRAVENOUS | Status: AC
  Administered 2016-07-24: 1000 mg via INTRAVENOUS
  Filled 2016-07-24: qty 100

## 2016-07-24 MED ORDER — MIDAZOLAM HCL 5 MG/5ML IJ SOLN
INTRAMUSCULAR | Status: DC | PRN
  Administered 2016-07-24: 2 mg via INTRAVENOUS

## 2016-07-24 MED ORDER — HYDROMORPHONE HCL 1 MG/ML IJ SOLN: 0.5000 mg | INTRAMUSCULAR | Status: DC | PRN

## 2016-07-24 MED ORDER — CEFAZOLIN SODIUM-DEXTROSE 2-4 GM/100ML-% IV SOLN
2.0000 g | Freq: Three times a day (TID) | INTRAVENOUS | Status: AC
  Administered 2016-07-24 (×2): 2 g via INTRAVENOUS
  Filled 2016-07-24 (×2): qty 100

## 2016-07-24 MED ORDER — TAMSULOSIN HCL 0.4 MG PO CAPS: 0.4000 mg | ORAL_CAPSULE | Freq: Every day | ORAL | Status: DC | PRN

## 2016-07-24 MED ORDER — BUPIVACAINE LIPOSOME 1.3 % IJ SUSP
INTRAMUSCULAR | Status: AC
  Filled 2016-07-24: qty 20

## 2016-07-24 MED ORDER — SENNA 8.6 MG PO TABS
1.0000 | ORAL_TABLET | Freq: Two times a day (BID) | ORAL | Status: DC
  Administered 2016-07-24 – 2016-07-25 (×2): 8.6 mg via ORAL
  Filled 2016-07-24 (×2): qty 1

## 2016-07-24 MED ORDER — LEVOTHYROXINE SODIUM 25 MCG PO TABS
25.0000 ug | ORAL_TABLET | Freq: Every day | ORAL | Status: DC
  Administered 2016-07-25: 25 ug via ORAL
  Filled 2016-07-24: qty 1

## 2016-07-24 MED ORDER — FENTANYL CITRATE (PF) 100 MCG/2ML IJ SOLN
INTRAMUSCULAR | Status: AC
  Filled 2016-07-24: qty 2

## 2016-07-24 MED ORDER — SODIUM CHLORIDE 0.9 % IJ SOLN
INTRAMUSCULAR | Status: AC
  Filled 2016-07-24: qty 50

## 2016-07-24 MED ORDER — FENTANYL CITRATE (PF) 100 MCG/2ML IJ SOLN
INTRAMUSCULAR | Status: DC | PRN
  Administered 2016-07-24 (×4): 50 ug via INTRAVENOUS
  Administered 2016-07-24: 100 ug via INTRAVENOUS
  Administered 2016-07-24: 50 ug via INTRAVENOUS

## 2016-07-24 MED ORDER — LACTATED RINGERS IV SOLN: INTRAVENOUS | Status: DC

## 2016-07-24 MED ORDER — HYDROMORPHONE HCL 1 MG/ML IJ SOLN
0.2500 mg | INTRAMUSCULAR | Status: DC | PRN
  Administered 2016-07-24 (×3): 0.5 mg via INTRAVENOUS

## 2016-07-24 MED ORDER — PROPOFOL 10 MG/ML IV BOLUS
INTRAVENOUS | Status: DC | PRN
  Administered 2016-07-24: 200 mg via INTRAVENOUS

## 2016-07-24 MED ORDER — OXYBUTYNIN CHLORIDE 5 MG PO TABS: 5.0000 mg | ORAL_TABLET | Freq: Three times a day (TID) | ORAL | Status: DC | PRN

## 2016-07-24 MED ORDER — HYDROMORPHONE HCL 1 MG/ML IJ SOLN
INTRAMUSCULAR | Status: AC
Start: 2016-07-24 — End: 2016-07-24
  Administered 2016-07-24: 0.5 mg via INTRAVENOUS
  Filled 2016-07-24: qty 1

## 2016-07-24 MED ORDER — LIDOCAINE 2% (20 MG/ML) 5 ML SYRINGE
INTRAMUSCULAR | Status: AC
  Filled 2016-07-24: qty 5

## 2016-07-24 MED ORDER — HYDROCODONE-ACETAMINOPHEN 10-325 MG PO TABS: 1.0000 | ORAL_TABLET | Freq: Four times a day (QID) | ORAL | 0 refills | Status: AC | PRN

## 2016-07-24 MED ORDER — MIDAZOLAM HCL 2 MG/2ML IJ SOLN
INTRAMUSCULAR | Status: AC
  Filled 2016-07-24: qty 2

## 2016-07-24 MED ORDER — ONDANSETRON HCL 4 MG/2ML IJ SOLN
INTRAMUSCULAR | Status: DC | PRN
  Administered 2016-07-24: 4 mg via INTRAVENOUS

## 2016-07-24 MED ORDER — POLYVINYL ALCOHOL 1.4 % OP SOLN
1.0000 [drp] | Freq: Three times a day (TID) | OPHTHALMIC | Status: DC | PRN
  Filled 2016-07-24: qty 15

## 2016-07-24 MED ORDER — KCL IN DEXTROSE-NACL 20-5-0.45 MEQ/L-%-% IV SOLN
INTRAVENOUS | Status: AC
  Administered 2016-07-24: 1000 mL via INTRAVENOUS
  Filled 2016-07-24: qty 1000

## 2016-07-24 MED ORDER — DIPHENHYDRAMINE HCL 12.5 MG/5ML PO ELIX: 12.5000 mg | ORAL_SOLUTION | Freq: Four times a day (QID) | ORAL | Status: DC | PRN

## 2016-07-24 MED ORDER — SUGAMMADEX SODIUM 200 MG/2ML IV SOLN
INTRAVENOUS | Status: AC
  Filled 2016-07-24: qty 2

## 2016-07-24 MED ORDER — DIPHENHYDRAMINE HCL 50 MG/ML IJ SOLN: 12.5000 mg | Freq: Four times a day (QID) | INTRAMUSCULAR | Status: DC | PRN

## 2016-07-24 MED ORDER — HYDROMORPHONE HCL 1 MG/ML IJ SOLN
INTRAMUSCULAR | Status: AC
  Administered 2016-07-24: 0.5 mg via INTRAVENOUS
  Filled 2016-07-24: qty 1

## 2016-07-24 SURGICAL SUPPLY — 67 items
APPLICATOR COTTON TIP 6IN STRL (MISCELLANEOUS) ×3 IMPLANT
BAG URO CATCHER STRL LF (MISCELLANEOUS) ×3 IMPLANT
CATH FOLEY 2WAY SLVR  5CC 16FR (CATHETERS) ×2
CATH FOLEY 2WAY SLVR 18FR 30CC (CATHETERS) IMPLANT
CATH FOLEY 2WAY SLVR 5CC 16FR (CATHETERS) ×1 IMPLANT
CATH FOLEY 3WAY  5CC 18FR (CATHETERS) ×2
CATH FOLEY 3WAY 5CC 18FR (CATHETERS) ×1 IMPLANT
CATH INTERMIT  6FR 70CM (CATHETERS) ×3 IMPLANT
CATH ROBINSON RED A/P 16FR (CATHETERS) IMPLANT
CATH ROBINSON RED A/P 8FR (CATHETERS) IMPLANT
CATH TIEMANN FOLEY 18FR 5CC (CATHETERS) IMPLANT
CHLORAPREP W/TINT 26ML (MISCELLANEOUS) ×3 IMPLANT
CLIP LIGATING HEM O LOK PURPLE (MISCELLANEOUS) IMPLANT
CLOTH BEACON ORANGE TIMEOUT ST (SAFETY) ×3 IMPLANT
COVER SURGICAL LIGHT HANDLE (MISCELLANEOUS) ×3 IMPLANT
COVER TIP SHEARS 8 DVNC (MISCELLANEOUS) ×1 IMPLANT
COVER TIP SHEARS 8MM DA VINCI (MISCELLANEOUS) ×2
CUTTER ECHEON FLEX ENDO 45 340 (ENDOMECHANICALS) IMPLANT
DECANTER SPIKE VIAL GLASS SM (MISCELLANEOUS) IMPLANT
DERMABOND ADVANCED (GAUZE/BANDAGES/DRESSINGS) ×2
DERMABOND ADVANCED .7 DNX12 (GAUZE/BANDAGES/DRESSINGS) ×1 IMPLANT
DRAPE ARM DVNC X/XI (DISPOSABLE) ×4 IMPLANT
DRAPE COLUMN DVNC XI (DISPOSABLE) ×1 IMPLANT
DRAPE DA VINCI XI ARM (DISPOSABLE) ×8
DRAPE DA VINCI XI COLUMN (DISPOSABLE) ×2
DRAPE SURG IRRIG POUCH 19X23 (DRAPES) ×3 IMPLANT
DRSG TEGADERM 4X4.75 (GAUZE/BANDAGES/DRESSINGS) ×3 IMPLANT
ELECT REM PT RETURN 9FT ADLT (ELECTROSURGICAL) ×3
ELECTRODE REM PT RTRN 9FT ADLT (ELECTROSURGICAL) ×1 IMPLANT
GLOVE BIO SURGEON STRL SZ 6.5 (GLOVE) ×2 IMPLANT
GLOVE BIO SURGEONS STRL SZ 6.5 (GLOVE) ×1
GLOVE BIOGEL M STRL SZ7.5 (GLOVE) ×9 IMPLANT
GOWN STRL REUS W/TWL LRG LVL3 (GOWN DISPOSABLE) ×15 IMPLANT
GUIDEWIRE STR DUAL SENSOR (WIRE) ×3 IMPLANT
HOLDER FOLEY CATH W/STRAP (MISCELLANEOUS) ×3 IMPLANT
IRRIG SUCT STRYKERFLOW 2 WTIP (MISCELLANEOUS) ×3
IRRIGATION SUCT STRKRFLW 2 WTP (MISCELLANEOUS) ×1 IMPLANT
IV LACTATED RINGERS 1000ML (IV SOLUTION) IMPLANT
LOOP VESSEL MAXI BLUE (MISCELLANEOUS) ×3 IMPLANT
MANIFOLD NEPTUNE II (INSTRUMENTS) ×3 IMPLANT
NDL SAFETY ECLIPSE 18X1.5 (NEEDLE) ×1 IMPLANT
NEEDLE HYPO 18GX1.5 SHARP (NEEDLE) ×2
PACK CYSTO (CUSTOM PROCEDURE TRAY) ×3 IMPLANT
PACK ROBOT UROLOGY CUSTOM (CUSTOM PROCEDURE TRAY) ×3 IMPLANT
RELOAD GREEN ECHELON 45 (STAPLE) IMPLANT
SEAL CANN UNIV 5-8 DVNC XI (MISCELLANEOUS) ×4 IMPLANT
SEAL XI 5MM-8MM UNIVERSAL (MISCELLANEOUS) ×8
SOLUTION ELECTROLUBE (MISCELLANEOUS) ×3 IMPLANT
STENT CONTOUR 6FRX26X.038 (STENTS) ×3 IMPLANT
SUT ETHILON 3 0 PS 1 (SUTURE) IMPLANT
SUT MNCRL 3 0 RB1 (SUTURE) ×1 IMPLANT
SUT MNCRL 3 0 VIOLET RB1 (SUTURE) ×1 IMPLANT
SUT MNCRL AB 4-0 PS2 18 (SUTURE) ×6 IMPLANT
SUT MONOCRYL 3 0 RB1 (SUTURE) ×4
SUT SILK 3 0 SH CR/8 (SUTURE) ×3 IMPLANT
SUT VIC AB 0 CT1 27 (SUTURE) ×2
SUT VIC AB 0 CT1 27XBRD ANTBC (SUTURE) ×1 IMPLANT
SUT VIC AB 0 UR5 27 (SUTURE) ×3 IMPLANT
SUT VIC AB 2-0 SH 27 (SUTURE) ×2
SUT VIC AB 2-0 SH 27X BRD (SUTURE) ×1 IMPLANT
SUT VICRYL 0 UR6 27IN ABS (SUTURE) ×6 IMPLANT
SYR 27GX1/2 1ML LL SAFETY (SYRINGE) IMPLANT
TOWEL OR 17X26 10 PK STRL BLUE (TOWEL DISPOSABLE) ×3 IMPLANT
TOWEL OR NON WOVEN STRL DISP B (DISPOSABLE) ×3 IMPLANT
TUBING CONNECTING 10 (TUBING) ×2 IMPLANT
TUBING CONNECTING 10' (TUBING) ×1
WATER STERILE IRR 1500ML POUR (IV SOLUTION) IMPLANT

## 2016-07-24 NOTE — Anesthesia Procedure Notes (Signed)
Procedure Name: Intubation Date/Time: 07/24/2016 7:27 AM Performed by: Montel Clock Pre-anesthesia Checklist: Patient identified, Emergency Drugs available, Suction available, Patient being monitored and Timeout performed Patient Re-evaluated:Patient Re-evaluated prior to inductionOxygen Delivery Method: Circle system utilized Preoxygenation: Pre-oxygenation with 100% oxygen Intubation Type: IV induction Ventilation: Mask ventilation without difficulty and Oral airway inserted - appropriate to patient size Laryngoscope Size: Mac and 3 Grade View: Grade I Tube type: Oral Tube size: 7.5 mm Number of attempts: 1 Airway Equipment and Method: Stylet Placement Confirmation: ETT inserted through vocal cords under direct vision,  positive ETCO2 and breath sounds checked- equal and bilateral Secured at: 23 cm Tube secured with: Tape Dental Injury: Teeth and Oropharynx as per pre-operative assessment

## 2016-07-24 NOTE — Progress Notes (Signed)
Patient ID: Antonio Grimes, male   DOB: 27-Apr-1976, 41 y.o.   MRN: [id number]  Post-op note  Subjective: The patient is doing well.  No complaints.  Objective: Vital signs in last 24 hours: Temp:  [98 F (36.7 C)-99 F (37.2 C)] 99 F (37.2 C) (03/19 1510) Pulse Rate:  [78-99] 95 (03/19 1510) Resp:  [14-20] 16 (03/19 1510) BP: (134-148)/(74-92) 145/86 (03/19 1510) SpO2:  [98 %-100 %] 100 % (03/19 1510) Weight:  [92.1 kg (203 lb)] 92.1 kg (203 lb) (03/19 0517)  Intake/Output from previous day: No intake/output data recorded. Intake/Output this shift: Total I/O In: 3572.9 [I.V.:3372.9; IV Piggyback:200] Out: 1700 [Urine:1675; Blood:25]  Physical Exam:  General: Alert and oriented. Abdomen: Soft, Nondistended. Incisions: Clean and dry.  Lab Results:  Recent Labs  07/24/16 1212  HGB 13.9  HCT 40.7    Assessment/Plan: POD#0   1) Continue to monitor, ambulate, IS   Moody BruinsLester S. Nadim Malia, Jr. MD   LOS: 1 day   Athanasius Kesling,LES 07/24/2016, 6:40 PM

## 2016-07-24 NOTE — Discharge Summary (Signed)
Physician Discharge Summary  Patient ID: Antonio Grimes MRN: [id number] DOB/AGE: [birth year] 41 y.o.  Admit date: 07/24/2016 Discharge date: 07/25/2016  Admission Diagnoses: Retroperitoneal fibrosis, bilateral hydronephrosis (R>L)  Discharge Diagnoses:  Past Medical History:  Diagnosis Date  . Bilateral hydronephrosis   . Graves disease   . Heat intolerance   . History of tachycardia   . Hyperlipidemia   . Hypertension   . Hyperthyroidism dx mid 2016   followed by dr Renato Shin  . Renal cyst, left     Discharged Condition: good  Hospital Course:   Antonio Grimes is a 41 y.o. male who is s/p RAL right ureterolysis with intraperitonealization of the right ureter, cystoscopy, right retrograde pyelogram and right ureteral stent placement on 07/24/16 with Dr. Alinda Money for retroperitoneal fibrosis of unknown etiology, obstructed right kidney on lasix renal scan with 25% function and bilateral hydronephrosis (R>L).  The patient tolerated the procedure well, was extubated in the OR and taken to the recovery unit for routine postoperative care. They were then transferred to the floor. By POD1 he had met the usual goals for discharge including ambulating at a preoperative capacity, having pain controlled with PO PRN medications, voiding spontaneously and tolerating a regular diet.  The patient will follow up with [facility] on 4/10. They will be discharged with prescriptions for vicodin.    Consults: None  Significant Diagnostic Studies: Cr 1.2 POD1  Treatments: surgery: as noted above  Discharge Exam: Blood pressure (!) 134/97, pulse 68, temperature 98.1 F (36.7 C), temperature source Oral, resp. rate 16, height '5\' 8"'  (1.727 m), weight 92.1 kg (203 lb), SpO2 100 %.  General:  well-developed and well-nourished male in NAD, lying in bed, alert & oriented, pleasant HEENT: [place]/AT, EOMI, sclera anicteric, hearing grossly intact, no nasal discharge, MMM Respiratory: nonlabored  respirations, satting well on RA, symmetrical chest rise Cardiovascular: pulse regular rate & rhythm Abdominal: soft, NTTP, nondistended, surgical incisions c/d/i without signs of exudate/erythema GU: voiding spontaneously (urine clear yellow prior to Foley removal) Extremities: warm, well-perfused, no c/c/e Neuro: no focal deficits   Disposition: 01-Home or Self Care   Allergies as of 07/25/2016   No Known Allergies     Medication List    STOP taking these medications   multivitamin tablet     TAKE these medications   amLODipine 5 MG tablet Commonly known as:  NORVASC Take 5 mg by mouth every evening.   HYDROcodone-acetaminophen 10-325 MG tablet Commonly known as:  NORCO Take 1 tablet by mouth every 6 (six) hours as needed for moderate pain or severe pain. Maximum dose per 24 hours - 8 pills What changed:  how much to take  when to take this  reasons to take this   hydroxypropyl methylcellulose / hypromellose 2.5 % ophthalmic solution Commonly known as:  ISOPTO TEARS / GONIOVISC Place 1 drop into both eyes 3 (three) times daily as needed for dry eyes.   levothyroxine 25 MCG tablet Commonly known as:  SYNTHROID, LEVOTHROID Take 25 mcg by mouth daily before breakfast.   methimazole 5 MG tablet Commonly known as:  TAPAZOLE Take 1 tablet (5 mg total) by mouth daily.   NALTREXONE HCL PO Take 4.5 mg by mouth at bedtime. Speciality compounded at [facility] 4.5 mg tablet   phenazopyridine 200 MG tablet Commonly known as:  PYRIDIUM Take 1 tablet (200 mg total) by mouth 3 (three) times daily as needed for pain.      Follow-up Information  Dutch Gray, MD On 08/15/2016.   Specialty:  Urology Why:  at 10:45 Contact information: [place] [id number] [phone]           Signed: Burnice Logan 07/25/2016, 11:23 AM

## 2016-07-24 NOTE — Discharge Instructions (Signed)

## 2016-07-24 NOTE — Anesthesia Postprocedure Evaluation (Signed)
Anesthesia Post Note  Patient: Antonio Grimes  Procedure(s) Performed: Procedure(s) (LRB): ROBOTIC ASSISTED LAPAROSCOPIC URETEROLYSIS/ INTRAPERITONEALIZATION OF RIGHT URETER/BIOPSY OF PERITONEAL MASS (Right) CYSTOSCOPY WITH RETROGRADE PYELOGRAM/ STENT (Right)  Patient location during evaluation: PACU Anesthesia Type: General Level of consciousness: awake and alert Pain management: pain level controlled Vital Signs Assessment: post-procedure vital signs reviewed and stable Respiratory status: spontaneous breathing, nonlabored ventilation, respiratory function stable and patient connected to nasal cannula oxygen Cardiovascular status: blood pressure returned to baseline and stable Postop Assessment: no signs of nausea or vomiting Anesthetic complications: no       Last Vitals:  Vitals:   07/24/16 1245 07/24/16 1345  BP: 134/85 134/74  Pulse: 88 83  Resp: 20 14  Temp: 37.2 C 37 C    Last Pain:  Vitals:   07/24/16 1345  TempSrc:   PainSc: 2                  Kennieth RadFitzgerald, Harper Vandervoort E

## 2016-07-24 NOTE — Op Note (Signed)
Preoperative diagnosis:  1. Right ureteral obstruction  Postoperative diagnosis: 1. 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 Cystoscopy 2. Right retrograde pyelography 3. Right ureteral stent (6 x 26) 4. Right robot-assisted laparoscopic ureterolysis with intraperitonealization of the ureter 5. Biopsy of retroperitoneal mass  Surgeon: Dr. Rolly SalterLester S. Kardell Virgil, Jr  Assistant: Harrie ForemanAmanda Dancy, PA-C  An assistant was required for this surgical procedure.  The duties of the assistant included but were not limited to suctioning, passing suture, camera manipulation, retraction. This procedure would not be able to be performed without an Geophysicist/field seismologistassistant.  Resident: Dr. Toni ArthursMatt Macey  Anesthesia: General  Complications: None  EBL: 25 cc  Specimens: Retroperitoneal mass  Disposition of specimens: Pathology  Intraoperative findings: Right retrograde pyelography demonstrated a normal distal ureter with significant tortuosity at the level of the iliac vessels and severe dilation above the level of the obstruction without filling defects.    Indication: Mr. Antonio Grimes is a 41 year old gentleman who was incidentally noted to have worsening renal function and bilateral hydronephrosis.  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  He eventually underwent stent placement with improvement of his renal function and then eventually attempted stent removal but follow up renogram imaging confirmed persistent right ureteral obstruction and loss of relative renal function on that side without evidence of obstruction on the left. He was counseled and elected to undergo the above procedures. I discussed the potential benefits and risks of the procedure, side effects of the proposed treatment, the likelihood of the patient achieving the goals of the procedure, and any potential problems that might occur during the procedure or recuperation. He gives  informed consent to proceed.  Description of procedure:  The patient was taken to the operating room and administered general anesthesia.  He was placed in the dorsal lithotomy position and given preoperative antibiotics.  He was prepped with betadine and a preoperative time out was performed.   Cystourethroscopy was then performed which revealed a normal urethra.  Inspection of the bladder revealed the ureteral orifices to be in their expected anatomic location.  No bladder tumors, stones, or other mucosal pathology was identified.  Attention then turned to the right ureteral orifice.  This was cannulated with a 6 French ureteral catheter and Omnipaque contrast was injected.  This revealed a normal distal ureter without filling defects.  However, there was noted to be a distinct transition point with tortuosity at the level of the iliac vessels with severe dilation proximally again without filling defects or other abnormalities consistent with his known extrinsic obstruction previously seen.  A 0.38 sensor guidewire was then advanced without difficulty up into the renal pelvis under fluoroscopic guidance.  A 6 x 26 double-J ureteral stent was advanced over the wire using Seldinger technique and positioned appropriately in the renal pelvis and bladder under fluoroscopic and cystoscopic guidance and the wire was removed.  A Foley catheter was inserted.  The patient was then repositioned in a modified right flank position.  Care was taken to pad all potential pressure points.  His abdomen was then prepped and draped in the usual sterile fashion.  Another preoperative timeout was performed.  A site was selected in the upper midline for an assistant 12 mm port.  This was placed using a standard open Hassan technique allowing direct entry into the abdomen.  Inspection of the peritoneum demonstrated no significant abnormalities.  Additional ports were then placed with 8 mm robotic ports placed supraumbilical, in  the lower mid abdomen, and 2 additional in the right upper quadrant.  All ports were placed under direct vision without difficulty.  The surgical cart was then docked.  The white line of Toldt was then incised along the length of the ascending colon and the colon was mobilized medially thereby exposing the retroperitoneum.  The ureter was not immediately identified.  Utilizing careful dissection, the vena cava was identified and dissection then proceeded laterally where a fibrotic mass was encountered.  Using careful dissection, a tedious process of carefully isolating the ureter from the surrounding fibrotic mass was performed.  The ureter was first identified anteriorly with subsequent circumferential dissection away from the fibrotic mass.  There appeared to be a clear plane allowing the fibrotic mass to be dissected off the ureter without injury to the ureter.  Care was taken to preserve the ureteral blood supply.  Once the ureter was isolated superior to the iliac vessels it was further isolated up to the level of the renal pelvis as this fibrotic mass did extend along the length of the ureter.  A vessel loop was placed around the ureter and used for careful retraction with the fourth arm.  Further dissection then proceeded distally.  The gonadal vein was encountered and was divided between Hem-o-lok clips.  There was a very dense mass surrounding the ureter over the level of the iliac vessels which required very careful dissection.  However, the ureter was able to be mobilized away from this mass and off the iliac vessels without complications or problems.  This was carried down distally past the iliac vessels where normal periureteral fat was encountered and the ureter tapered down to a normal caliber.  With the ureter now completely isolated from the retroperitoneal fibrotic mass, samples of the mass were excised and removed for biopsy pathology.  Considering the long length of ureter that appeared to be  involved with this fibrotic mass, it was decided to perform intraperitonealization of the ureter.  The edges of the peritoneum were reapproximated posterior to the ureter utilizing running 3-0 silk suture.  This allowed the ureter to remaining within the peritoneal cavity from the renal pelvis to the distal normal ureter.  The colon was then reapproximated over the ureter in its normal anatomic location.  Attention then turned to closure.  All port sites were removed under direct vision.  The 12 mm assistant port in the upper midline was closed with running 0 Vicryl suture.  All port sites were injected with Exparel and closed at the skin level with 4-0 Monocryl subcuticular closures.  Liquiband was applied to each incision site.  The patient appeared to tolerate the procedure well without complications.  He was able to be awakened and transferred to the recovery unit in satisfactory condition.

## 2016-07-24 NOTE — Transfer of Care (Signed)
Immediate Anesthesia Transfer of Care Note  Patient: Antonio Grimes  Procedure(s) Performed: Procedure(s): ROBOTIC ASSISTED LAPAROSCOPIC URETEROLYSIS/ INTRAPERITONEALIZATION OF RIGHT URETER/BIOPSY OF PERITONEAL MASS (Right) CYSTOSCOPY WITH RETROGRADE PYELOGRAM/ STENT (Right)  Patient Location: PACU  Anesthesia Type:General  Level of Consciousness: awake, alert  and oriented  Airway & Oxygen Therapy: Patient Spontanous Breathing and Patient connected to face mask oxygen  Post-op Assessment: Report given to RN and Post -op Vital signs reviewed and stable  Post vital signs: Reviewed and stable  Last Vitals:  Vitals:   07/24/16 0517  BP: (!) 145/91  Pulse: 78  Resp: 16  Temp: 36.7 C    Last Pain:  Vitals:   07/24/16 0517  TempSrc: Oral      Patients Stated Pain Goal: 4 (07/24/16 0544)  Complications: No apparent anesthesia complications

## 2016-07-25 DIAGNOSIS — N131 Hydronephrosis with ureteral stricture, not elsewhere classified: Secondary | ICD-10-CM | POA: Diagnosis not present

## 2016-07-25 LAB — BASIC METABOLIC PANEL
Anion gap: 6 (ref 5–15)
BUN: 11 mg/dL (ref 6–20)
CALCIUM: 8.8 mg/dL — AB (ref 8.9–10.3)
CHLORIDE: 107 mmol/L (ref 101–111)
CO2: 27 mmol/L (ref 22–32)
CREATININE: 1.24 mg/dL (ref 0.61–1.24)
GFR calc non Af Amer: >60 mL/min (ref >60)
GLUCOSE: 135 mg/dL — AB (ref 65–99)
Potassium: 4.4 mmol/L (ref 3.5–5.1)
Sodium: 140 mmol/L (ref 135–145)

## 2016-07-25 LAB — HEMOGLOBIN AND HEMATOCRIT, BLOOD
HCT: 39.1 % (ref 39.0–52.0)
Hemoglobin: 13.5 g/dL (ref 13.0–17.0)

## 2016-07-25 MED ORDER — BISACODYL 10 MG RE SUPP
10.0000 mg | Freq: Once | RECTAL | Status: AC
  Administered 2016-07-25: 10 mg via RECTAL
  Filled 2016-07-25: qty 1

## 2016-07-25 NOTE — Progress Notes (Signed)
Patient ID: Antonio Grimes, male   DOB: [birth year], 41 y.o.   MRN: [id number]  1 Day Post-Op Subjective: Pt doing well without complaints.  Pain controlled.  Objective: Vital signs in last 24 hours: Temp:  [98 F (36.7 C)-99.3 F (37.4 C)] 98.1 F (36.7 C) (03/20 0527) Pulse Rate:  [68-99] 68 (03/20 0527) Resp:  [14-20] 16 (03/20 0527) BP: (122-148)/(74-97) 134/97 (03/20 0527) SpO2:  [100 %] 100 % (03/20 0527)  Intake/Output from previous day: 03/19 0701 - 03/20 0700 In: 4860.4 [I.V.:4260.4; IV Piggyback:600] Out: 3750 [Urine:3725; Blood:25] Intake/Output this shift: No intake/output data recorded.  Physical Exam:  General: Alert and oriented Abd: Soft, ND Inc: C/D/I  Lab Results:  Recent Labs  07/24/16 1212 07/25/16 0514  HGB 13.9 13.5  HCT 40.7 39.1   BMET  Recent Labs  07/24/16 1212 07/25/16 0514  NA 137 140  K 4.0 4.4  CL 104 107  CO2 27 27  GLUCOSE 130* 135*  BUN 15 11  CREATININE 1.45* 1.24  CALCIUM 8.8* 8.8*     Studies/Results:   Assessment/Plan: POD # 1 s/p right RAL ureterolysis - D/C catheter - Ambulate, IS - Po pain meds - Advance diet - Discharge home   LOS: 1 day   Gennifer Potenza,LES 07/25/2016, 7:11 AM

## 2016-07-26 ENCOUNTER — Encounter (HOSPITAL_COMMUNITY): Payer: Self-pay | Admitting: Urology

## 2016-09-25 ENCOUNTER — Other Ambulatory Visit (HOSPITAL_COMMUNITY): Payer: Self-pay | Admitting: Urology

## 2016-09-25 DIAGNOSIS — N1339 Other hydronephrosis: Secondary | ICD-10-CM

## 2016-10-26 ENCOUNTER — Encounter (HOSPITAL_COMMUNITY)
Admission: RE | Admit: 2016-10-26 | Discharge: 2016-10-26 | Disposition: A | Discharge status: Home / Self Care | Payer: 59 | Source: Ambulatory Visit | Attending: Urology | Admitting: Urology

## 2016-10-26 DIAGNOSIS — N1339 Other hydronephrosis: Secondary | ICD-10-CM | POA: Insufficient documentation

## 2016-10-26 MED ORDER — FUROSEMIDE 10 MG/ML IJ SOLN
46.0000 mg | Freq: Once | INTRAMUSCULAR | Status: AC
  Administered 2016-10-26: 46 mg via INTRAVENOUS

## 2016-10-26 MED ORDER — FUROSEMIDE 10 MG/ML IJ SOLN
INTRAMUSCULAR | Status: AC
  Filled 2016-10-26: qty 8

## 2016-10-26 MED ORDER — TECHNETIUM TC 99M MERTIATIDE
5.5000 | Freq: Once | INTRAVENOUS | Status: AC | PRN
  Administered 2016-10-26: 5.5 via INTRAVENOUS

## 2017-02-05 IMAGING — US US ABDOMEN COMPLETE
1 series · 13 of 25 positions shown · non-contrast
Comparison: None in PACs

CLINICAL DATA: Epigastric and left upper quadrant pain for the past
month ; no history of kidney stones.

EXAM:
ABDOMEN ULTRASOUND COMPLETE

[Series 1: us abdomen complete · 0.26mm/px · 13 of 89 slices shown]
[im 1/89]
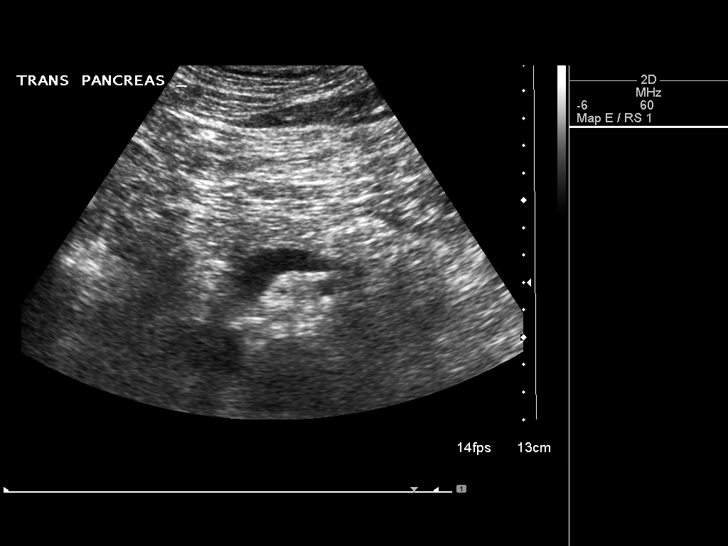
[im 8/89]
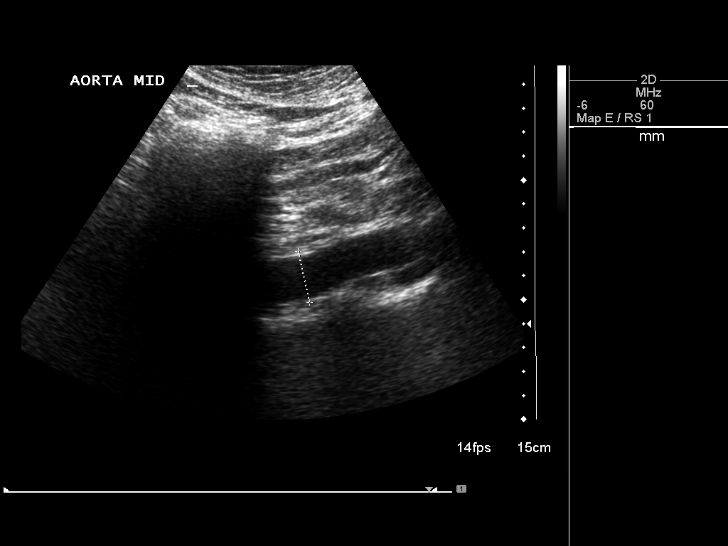
[im 15/89]
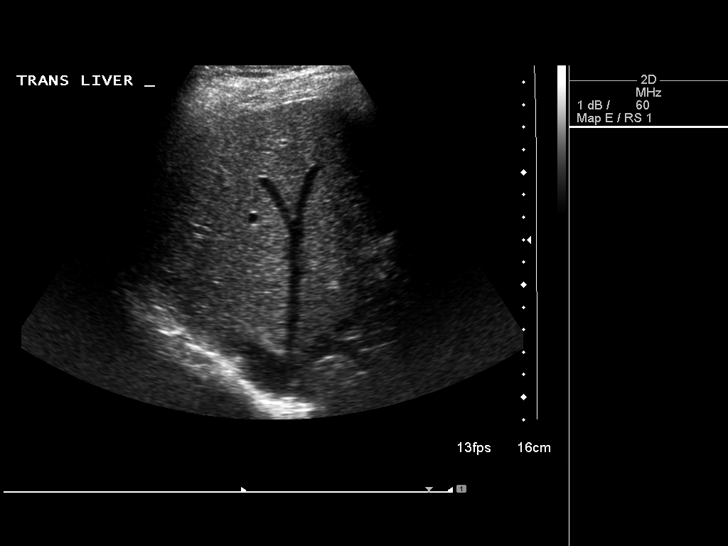
[im 23/89]
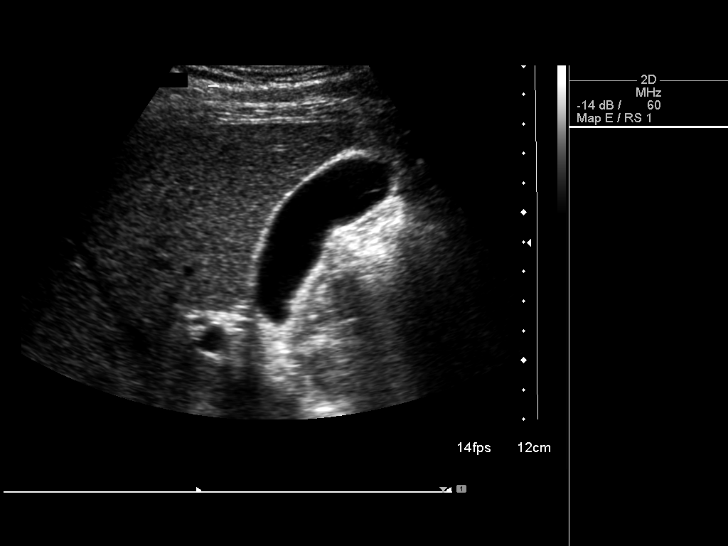
[im 30/89]
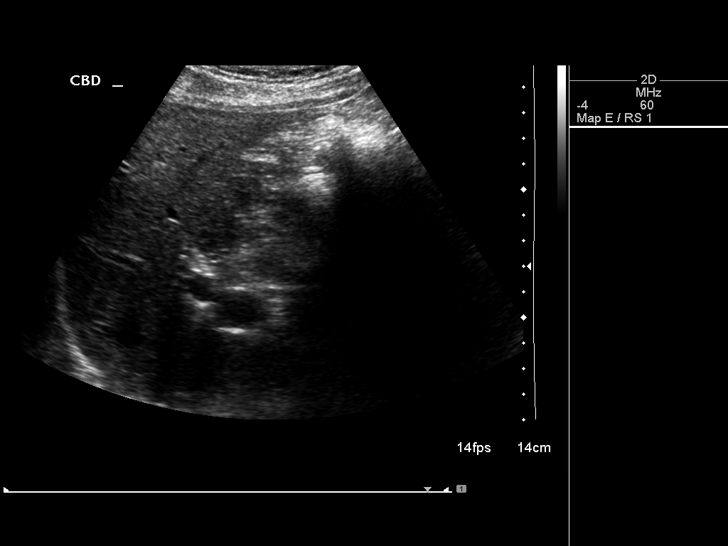
[im 37/89]
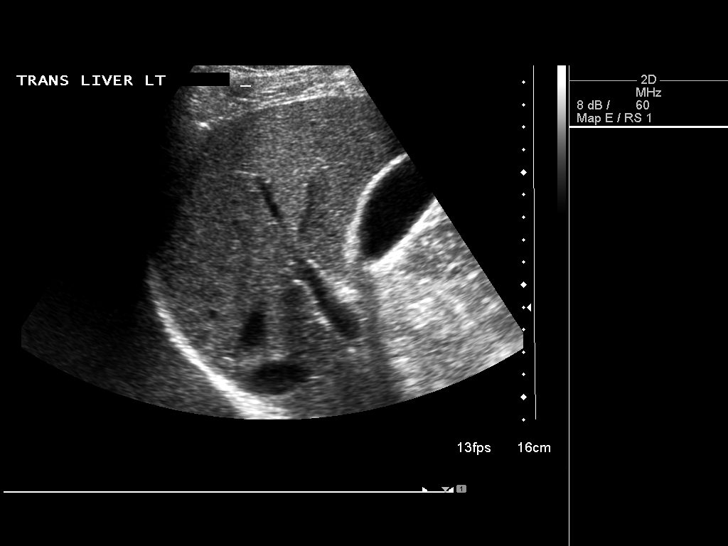
[im 45/89]
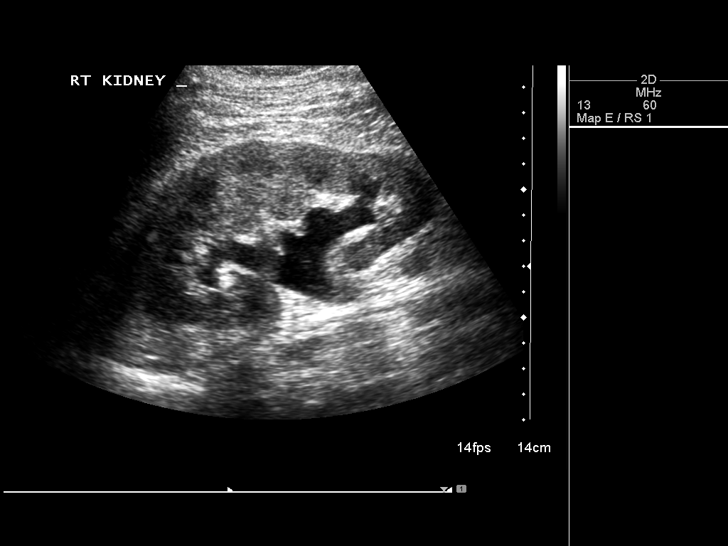
[im 52/89]
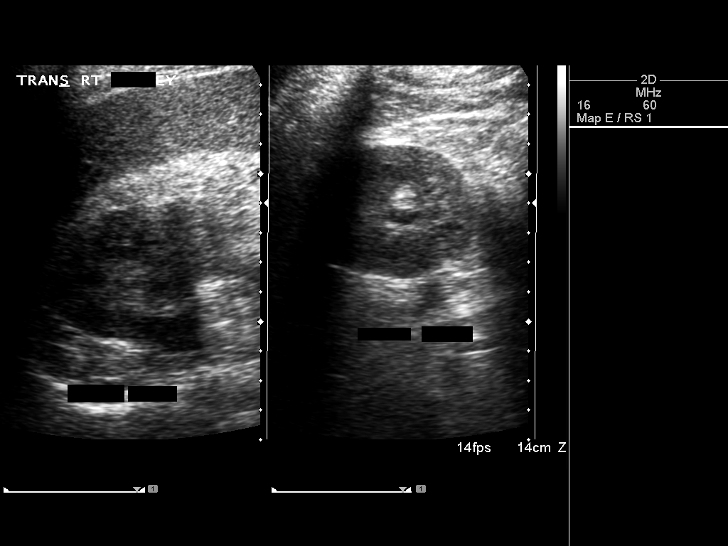
[im 59/89]
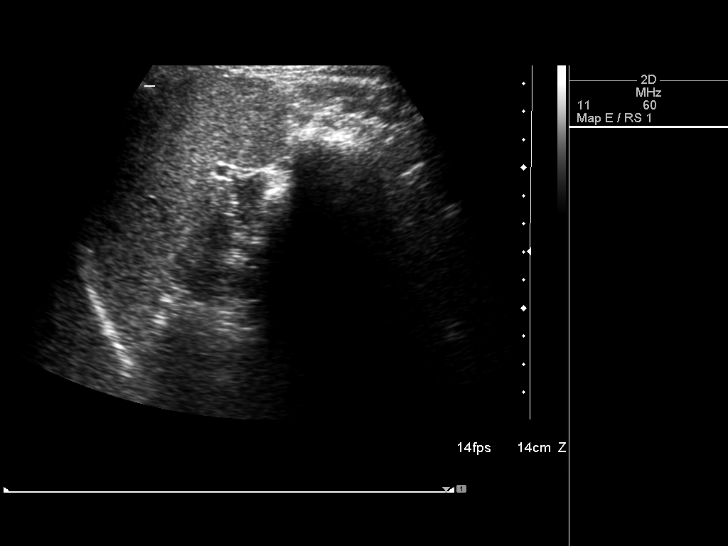
[im 67/89]
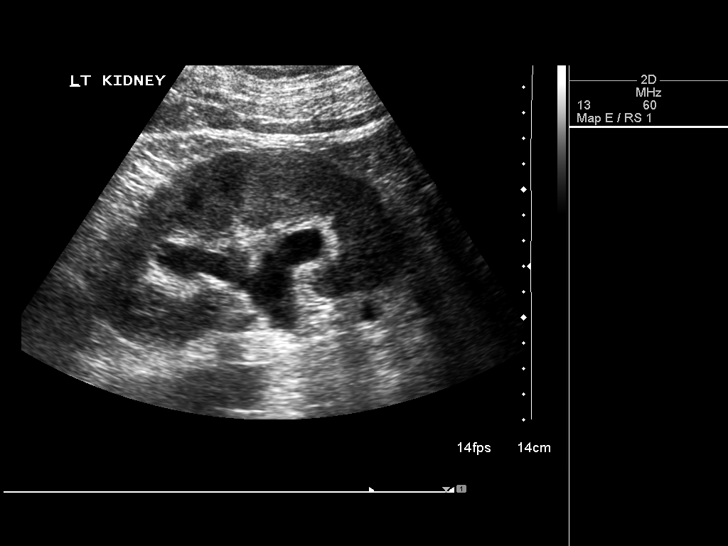
[im 74/89]
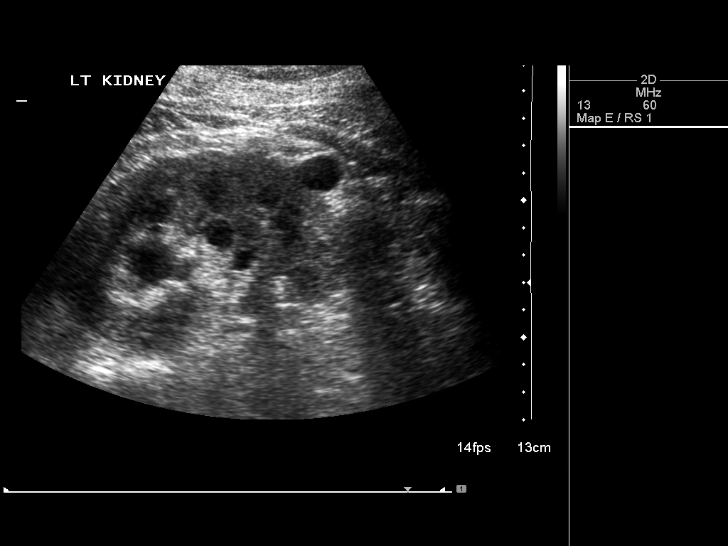
[im 81/89]
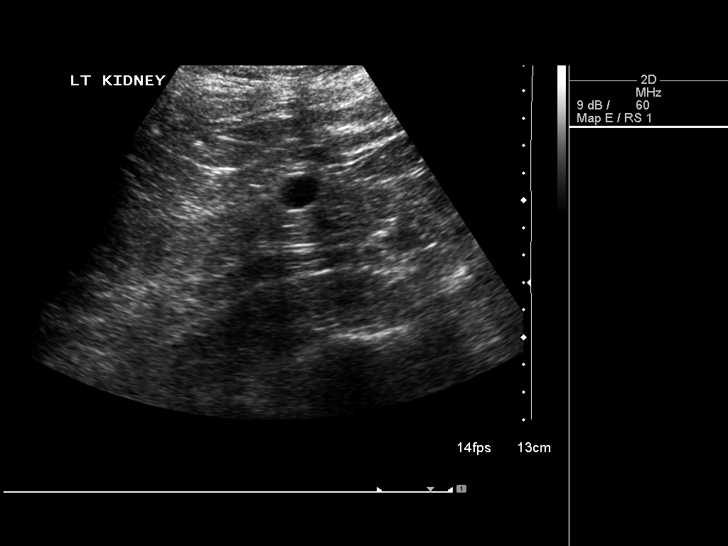
[im 89/89]
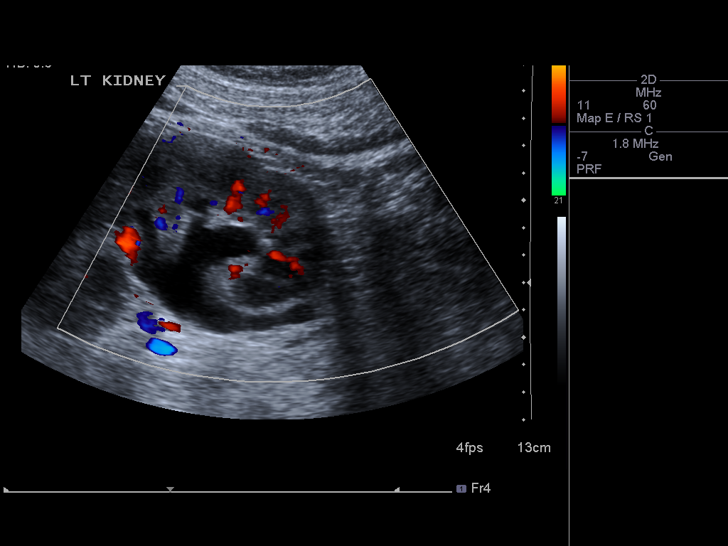

[13 of 25 positions shown; findings below may reference images not displayed]

FINDINGS: Gallbladder: No gallstones or wall thickening visualized. No
sonographic Murphy sign noted by sonographer.

Common bile duct: Diameter: 3.3 mm

Liver: No focal lesion identified. Within normal limits in
parenchymal echogenicity.

IVC: No abnormality visualized.

Pancreas: Bowel gas limits evaluation of the pancreatic head and
tail. The pancreatic body is normal in appearance.

Spleen: Size and appearance within normal limits.

Right Kidney: Length: 13.5 cm. There is moderate right-sided
hydronephrosis and proximal hydroureter. The renal cortical
echotexture remains lower than that of the adjacent liver. No
discrete stones are observed. There is no solid mass.

Left Kidney: Length: 12.7 cm. Hydronephrosis similar to that on the
right. No discrete obstructing stones are observed. There are 2
cortical cysts in the midpole measuring up to 1.6 cm in diameter.

Abdominal aorta: No aneurysm visualized.

Other findings: There is no ascites.
IMPRESSION: 1. Moderate bilateral hydronephrosis without visible stones. Simple
appearing midpole cysts in the left kidney. The visualized portions
of the proximal ureters are dilated as well. Further evaluation with
a CT urogram would be a useful next imaging step.
2. No acute hepatobiliary abnormality. Limited visualization of the
pancreas.

## 2017-10-02 ENCOUNTER — Other Ambulatory Visit (HOSPITAL_COMMUNITY): Payer: Self-pay | Admitting: Urology

## 2017-10-02 DIAGNOSIS — N131 Hydronephrosis with ureteral stricture, not elsewhere classified: Secondary | ICD-10-CM

## 2017-11-14 ENCOUNTER — Encounter (HOSPITAL_COMMUNITY)
Admission: RE | Admit: 2017-11-14 | Discharge: 2017-11-14 | Disposition: A | Discharge status: Home / Self Care | Payer: 59 | Source: Ambulatory Visit | Attending: Urology | Admitting: Urology

## 2017-11-14 DIAGNOSIS — N131 Hydronephrosis with ureteral stricture, not elsewhere classified: Secondary | ICD-10-CM | POA: Insufficient documentation

## 2017-11-14 MED ORDER — FUROSEMIDE 10 MG/ML IJ SOLN
46.0000 mg | Freq: Once | INTRAMUSCULAR | Status: AC
  Administered 2017-11-14: 46 mg via INTRAVENOUS

## 2017-11-14 MED ORDER — TECHNETIUM TC 99M MERTIATIDE
5.2000 | Freq: Once | INTRAVENOUS | Status: AC
  Administered 2017-11-14: 5.2 via INTRAVENOUS

## 2017-11-14 MED ORDER — FUROSEMIDE 10 MG/ML IJ SOLN
INTRAMUSCULAR | Status: AC
  Filled 2017-11-14: qty 4

## 2018-09-24 ENCOUNTER — Other Ambulatory Visit (HOSPITAL_COMMUNITY): Payer: Self-pay | Admitting: Urology

## 2018-09-24 ENCOUNTER — Other Ambulatory Visit: Payer: Self-pay | Admitting: Urology

## 2018-09-24 DIAGNOSIS — N131 Hydronephrosis with ureteral stricture, not elsewhere classified: Secondary | ICD-10-CM

## 2018-11-14 ENCOUNTER — Ambulatory Visit (HOSPITAL_COMMUNITY): Payer: 59

## 2018-11-14 ENCOUNTER — Encounter (HOSPITAL_COMMUNITY): Payer: Self-pay

## 2019-04-30 IMAGING — NM NM RENAL IMAGING FLOW W/ PHARM
2 series · 12 of 12 positions shown · non-contrast
Comparison: 10/26/2016

CLINICAL DATA: Follow-up hydronephrosis from 3 years ago due to
ureteral stricture, prior stenting

EXAM:
NUCLEAR MEDICINE RENAL SCAN WITH DIURETIC ADMINISTRATION
TECHNIQUE: Radionuclide angiographic and sequential renal images were obtained
after intravenous injection of radiopharmaceutical. Imaging was
continued during slow intravenous injection of Lasix approximately
15 minutes after the start of the examination.
RADIOPHARMACEUTICALS:  5.2 mCi Vechnetium-BBm MAG3 IV
Pharmaceutical: Lasix 46 mg IV 20 minutes into imaging

[Series 1: renal scan · 4.14mm/px · 6 of 40 frames shown (1 of 2)]
[frame 4/40]
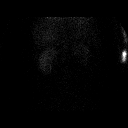
[frame 10/40]
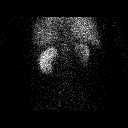
[frame 17/40]
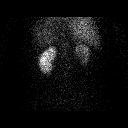
[frame 24/40]
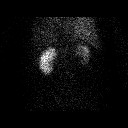
[frame 30/40]
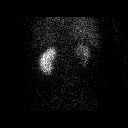
[frame 37/40]
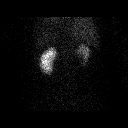

[Series 1: renal scan · 4.14mm/px · 6 of 70 frames shown (2 of 2)]
[frame 6/70]
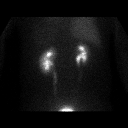
[frame 18/70]
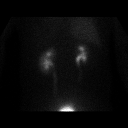
[frame 30/70]
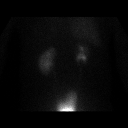
[frame 41/70]
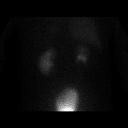
[frame 53/70]
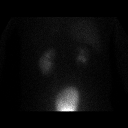
[frame 65/70]
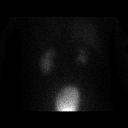

[12 of 12 positions shown; findings below may reference images not displayed]

FINDINGS: Flow: Prompt symmetric arterial flow LEFT kidney, with delayed flow
to a small RIGHT kidney.

Left renogram: Normal uptake, concentration and excretion of tracer
by LEFT kidney. Normal washout of tracer from the LEFT kidney prior
to diuretic administration. No retention of tracer at the conclusion
of the exam. Quantitative analysis reveals normal time to peak
activity of 3.2 minutes with normal fall to half maximum activity
6.5 minutes later.

Right renogram: Small RIGHT kidney. Overall diminished function
versus LEFT kidney. Normal uptake, concentration, and excretion of
tracer. Good clearance of tracer with no significant residual by the
conclusion of the exam. Normal time to peak activity of 4.7 minutes
of with fall to half maximum activity 11 minutes later.

Differential:

Left kidney = 82 %

Right kidney = 18 %

T1/2 post Lasix :

Left kidney = normal excretion pre Lasix, with little residual
tracer post Lasix for assessment

Right kidney = 7.6 min
IMPRESSION: Normal LEFT renogram.

Diminished function of RIGHT kidney without evidence of urinary
outflow obstruction.

Markedly asymmetric renal function, 82% LEFT versus 18% RIGHT.

## 2021-06-20 ENCOUNTER — Other Ambulatory Visit (HOSPITAL_COMMUNITY): Payer: Self-pay | Admitting: Urology

## 2021-06-20 ENCOUNTER — Other Ambulatory Visit: Payer: Self-pay | Admitting: Urology

## 2021-06-20 DIAGNOSIS — N430 Encysted hydrocele: Secondary | ICD-10-CM

## 2021-06-20 DIAGNOSIS — N131 Hydronephrosis with ureteral stricture, not elsewhere classified: Secondary | ICD-10-CM

## 2021-06-29 ENCOUNTER — Ambulatory Visit (HOSPITAL_COMMUNITY): Payer: 59

## 2021-06-30 ENCOUNTER — Ambulatory Visit (HOSPITAL_COMMUNITY)
Admission: RE | Admit: 2021-06-30 | Discharge: 2021-06-30 | Disposition: A | Discharge status: Home / Self Care | Payer: 59 | Source: Ambulatory Visit | Attending: Urology | Admitting: Urology

## 2021-06-30 ENCOUNTER — Other Ambulatory Visit: Payer: Self-pay

## 2021-06-30 DIAGNOSIS — N430 Encysted hydrocele: Secondary | ICD-10-CM | POA: Insufficient documentation

## 2021-06-30 DIAGNOSIS — N131 Hydronephrosis with ureteral stricture, not elsewhere classified: Secondary | ICD-10-CM | POA: Diagnosis present

## 2021-06-30 MED ORDER — FUROSEMIDE 10 MG/ML IJ SOLN
INTRAMUSCULAR | Status: AC
  Filled 2021-06-30: qty 8

## 2021-06-30 MED ORDER — FUROSEMIDE 10 MG/ML IJ SOLN: 46.0000 mg | Freq: Once | INTRAMUSCULAR | Status: DC

## 2021-06-30 MED ORDER — TECHNETIUM TC 99M MERTIATIDE
5.5000 | Freq: Once | INTRAVENOUS | Status: AC | PRN
  Administered 2021-06-30: 5.5 via INTRAVENOUS
